# Patient Record
Sex: Female | Born: 1970
Health system: Southern US, Community
[De-identification: ages and names within clinical notes are randomized; demographics above are authoritative.]

## PROBLEM LIST (undated history)

## (undated) DIAGNOSIS — F329 Major depressive disorder, single episode, unspecified: Secondary | ICD-10-CM

## (undated) DIAGNOSIS — F32A Depression, unspecified: Secondary | ICD-10-CM

## (undated) HISTORY — PX: NECK SURGERY: SHX720

## (undated) HISTORY — PX: OTHER SURGICAL HISTORY: SHX169

## (undated) HISTORY — PX: WISDOM TOOTH EXTRACTION: SHX21

---

## 1999-04-19 ENCOUNTER — Other Ambulatory Visit: Admission: RE | Admit: 1999-04-19 | Discharge: 1999-04-19 | Payer: Self-pay | Admitting: Family Medicine

## 2000-09-30 ENCOUNTER — Other Ambulatory Visit: Admission: RE | Admit: 2000-09-30 | Discharge: 2000-09-30 | Payer: Self-pay | Admitting: Family Medicine

## 2001-07-02 ENCOUNTER — Ambulatory Visit (HOSPITAL_BASED_OUTPATIENT_CLINIC_OR_DEPARTMENT_OTHER): Admission: RE | Admit: 2001-07-02 | Discharge: 2001-07-02 | Payer: Self-pay | Admitting: Orthopedic Surgery

## 2001-10-08 ENCOUNTER — Other Ambulatory Visit: Admission: RE | Admit: 2001-10-08 | Discharge: 2001-10-08 | Payer: Self-pay | Admitting: Family Medicine

## 2001-11-06 ENCOUNTER — Other Ambulatory Visit: Admission: RE | Admit: 2001-11-06 | Discharge: 2001-11-06 | Payer: Self-pay | Admitting: Family Medicine

## 2002-11-08 ENCOUNTER — Other Ambulatory Visit: Admission: RE | Admit: 2002-11-08 | Discharge: 2002-11-08 | Payer: Self-pay | Admitting: Family Medicine

## 2003-11-18 ENCOUNTER — Other Ambulatory Visit: Admission: RE | Admit: 2003-11-18 | Discharge: 2003-11-18 | Payer: Self-pay | Admitting: Family Medicine

## 2004-08-27 ENCOUNTER — Ambulatory Visit: Payer: Self-pay

## 2004-09-25 ENCOUNTER — Other Ambulatory Visit: Admission: RE | Admit: 2004-09-25 | Discharge: 2004-09-25 | Payer: Self-pay | Admitting: Family Medicine

## 2006-07-21 ENCOUNTER — Other Ambulatory Visit: Admission: RE | Admit: 2006-07-21 | Discharge: 2006-07-21 | Payer: Self-pay | Admitting: Family Medicine

## 2008-06-21 ENCOUNTER — Other Ambulatory Visit: Admission: RE | Admit: 2008-06-21 | Discharge: 2008-06-21 | Payer: Self-pay | Admitting: Family Medicine

## 2010-08-03 NOTE — Op Note (Signed)
[facility]  Patient:    Diana Rangel, Diana Rangel Visit Number: [id number] MRN: [id number]          Service Type: DSU Location: [facility] Attending Physician:  Nadara Mustard Dictated by:   Nadara Mustard, M.D. Proc. Date: 07/02/01 Admit Date:  07/02/2001                             Operative Report  PREOPERATIVE DIAGNOSIS:  Medial meniscal tear left knee.  POSTOPERATIVE DIAGNOSES: 1. Medial meniscal tear left knee with loose meniscal fragment. 2. 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 3. Partial acromioclavicular tear.  OPERATION/PROCEDURE: 1. Left knee arthroscopy with debridement and partial medial meniscectomy. 2. Abrasion chondroplasty of patella and trochlea. 3. Exam under anesthesia with a negative pivot and shift and negative    anterior drawer.  SURGEON:  Nadara Mustard, M.D.  ANESTHESIA:  General.  ESTIMATED BLOOD LOSS:  Minimal.  ANTIBIOTICS:  None.  BLOOD REPLACEMENT:  None.  DRAINS:  None.  COMPLICATIONS:  None.  DISPOSITION:  To PACU in stable condition.  INDICATIONS:  The patient is a 40 year old woman, active soccer athlete, who has had mechanical symptoms in her left knee. She has failed conservative care and MRI scan confirmed medial meniscal tear and she presents at this time for arthroscopic intervention. The risks and benefits were discussed including infection, neurovascular injury, persistent pain, and/or need for additional surgery. The patient states that she understands and wishes to proceed at this time.  DESCRIPTION OF PROCEDURE:  The patient was brought to OR Room #1 and underwent a general anesthetic. After an adequate level of anesthesia was obtained, the patients left lower extremity was prepped using DuraPrep and draped into a sterile field. The patient did have poison ivy approximately two weeks ago, but all the lesions had completely dried up and there was no open lesions. All areas  had epithelialized and there was none in the area of the direct arthroscopic portals. The arthroscope was inserted through the inferolateral portal and the inferomedial portal was used for a working portal. Visualization of the medial joint line shows her to have a large loose fragment medial meniscal tear. This was debrided. The edge of the meniscus was probed and was stable. She had no cartilage defects of the medial femoral condyle or medial tibial plateau. Paper Mitek was used for hemostasis; this was not used on the cartilage at all. Examination of the notch did show that approximately a half of the ACL had been previously torn remotely. She did have a good pivot shift with no instability and had a stable anterior drawer under anesthesia, but was missing approximately half of her ACL attachment at the femoral notch. Examination in the figure four position showed her to have intact lateral meniscus and there was no cartilage defects of the lateral tibial plateau or lateral femoral condyle. Examination in the patellofemoral joint showed her to have a large grade IV osteochondral defect of the patella and trochlea and this was debrided back to stable cartilage. Visualization of both medial and lateral gutters shows there to be no loose bodies and a survey was then again performed; there was no loose bodies. Hemostasis was obtained with a Paper Mitek. The instruments were removed. The portals were closed using 4-0 nylon. The joint was infused with 20 cc of 0.50% Marcaine plain and 4 mg of morphine. The patient was dressed with Adaptic orthopedic sponges,  sterile web roll and Ace wrap from toes to thighs. She was extubated and taken to the PACU in stable condition. Plan to follow up in the office in two weeks. Dictated by:   Nadara Mustard, M.D. Attending Physician:  Nadara Mustard DD:  07/02/01 TD:  07/03/01 Job: [id number]

## 2012-04-27 ENCOUNTER — Other Ambulatory Visit: Payer: Self-pay | Admitting: Gynecology

## 2012-04-27 DIAGNOSIS — R928 Other abnormal and inconclusive findings on diagnostic imaging of breast: Secondary | ICD-10-CM

## 2012-05-19 ENCOUNTER — Other Ambulatory Visit: Payer: Self-pay

## 2012-05-21 ENCOUNTER — Ambulatory Visit
Admission: RE | Admit: 2012-05-21 | Discharge: 2012-05-21 | Disposition: A | Discharge status: Home / Self Care | Payer: 59 | Source: Ambulatory Visit | Attending: Gynecology | Admitting: Gynecology

## 2012-05-21 DIAGNOSIS — R928 Other abnormal and inconclusive findings on diagnostic imaging of breast: Secondary | ICD-10-CM

## 2012-07-06 ENCOUNTER — Other Ambulatory Visit (HOSPITAL_COMMUNITY): Payer: Self-pay | Admitting: Gynecology

## 2012-07-06 DIAGNOSIS — Z3141 Encounter for fertility testing: Secondary | ICD-10-CM

## 2012-07-14 ENCOUNTER — Ambulatory Visit (HOSPITAL_COMMUNITY)
Admission: RE | Admit: 2012-07-14 | Discharge: 2012-07-14 | Disposition: A | Discharge status: Home / Self Care | Payer: 59 | Source: Ambulatory Visit | Attending: Gynecology | Admitting: Gynecology

## 2012-07-14 DIAGNOSIS — Z3141 Encounter for fertility testing: Secondary | ICD-10-CM

## 2012-07-14 DIAGNOSIS — N979 Female infertility, unspecified: Secondary | ICD-10-CM | POA: Insufficient documentation

## 2012-07-14 MED ORDER — IOHEXOL 300 MG/ML  SOLN
10.0000 mL | Freq: Once | INTRAMUSCULAR | Status: AC | PRN
  Administered 2012-07-14: 20 mL

## 2012-08-13 ENCOUNTER — Encounter (HOSPITAL_COMMUNITY): Payer: Self-pay | Admitting: *Deleted

## 2012-08-13 ENCOUNTER — Encounter (HOSPITAL_COMMUNITY): Payer: Self-pay | Admitting: Pharmacy Technician

## 2012-08-26 ENCOUNTER — Ambulatory Visit (HOSPITAL_COMMUNITY)
Admission: RE | Admit: 2012-08-26 | Discharge: 2012-08-26 | Disposition: A | Discharge status: Home / Self Care | Payer: 59 | Source: Ambulatory Visit | Attending: Obstetrics and Gynecology | Admitting: Obstetrics and Gynecology

## 2012-08-26 ENCOUNTER — Encounter (HOSPITAL_COMMUNITY): Payer: Self-pay | Admitting: Anesthesiology

## 2012-08-26 ENCOUNTER — Encounter (HOSPITAL_COMMUNITY)
Admission: RE | Disposition: A | Discharge status: Home / Self Care | Payer: Self-pay | Source: Ambulatory Visit | Attending: Obstetrics and Gynecology

## 2012-08-26 ENCOUNTER — Ambulatory Visit (HOSPITAL_COMMUNITY): Payer: 59 | Admitting: Anesthesiology

## 2012-08-26 DIAGNOSIS — N979 Female infertility, unspecified: Secondary | ICD-10-CM | POA: Insufficient documentation

## 2012-08-26 HISTORY — DX: Depression, unspecified: F32.A

## 2012-08-26 HISTORY — PX: HYSTEROSCOPY WITH D & C: SHX1775

## 2012-08-26 HISTORY — DX: Major depressive disorder, single episode, unspecified: F32.9

## 2012-08-26 LAB — CBC
Hemoglobin: 13.2 g/dL (ref 12.0–15.0)
MCH: 29.4 pg (ref 26.0–34.0)
MCHC: 33.9 g/dL (ref 30.0–36.0)
Platelets: 186 10*3/uL (ref 150–400)

## 2012-08-26 SURGERY — DILATATION AND CURETTAGE /HYSTEROSCOPY
Anesthesia: General | Site: Vagina | Wound class: Clean Contaminated

## 2012-08-26 MED ORDER — LIDOCAINE HCL (CARDIAC) 20 MG/ML IV SOLN
INTRAVENOUS | Status: AC
  Filled 2012-08-26: qty 5

## 2012-08-26 MED ORDER — PROPOFOL 10 MG/ML IV BOLUS
INTRAVENOUS | Status: DC | PRN
  Administered 2012-08-26: 170 mg via INTRAVENOUS

## 2012-08-26 MED ORDER — IBUPROFEN 200 MG PO TABS: 600.0000 mg | ORAL_TABLET | Freq: Four times a day (QID) | ORAL | Status: DC | PRN

## 2012-08-26 MED ORDER — ONDANSETRON HCL 4 MG/2ML IJ SOLN
INTRAMUSCULAR | Status: AC
  Filled 2012-08-26: qty 2

## 2012-08-26 MED ORDER — LACTATED RINGERS IV SOLN
INTRAVENOUS | Status: DC
  Administered 2012-08-26 (×2): via INTRAVENOUS

## 2012-08-26 MED ORDER — FENTANYL CITRATE 0.05 MG/ML IJ SOLN
INTRAMUSCULAR | Status: AC
  Filled 2012-08-26: qty 2

## 2012-08-26 MED ORDER — GLYCOPYRROLATE 0.2 MG/ML IJ SOLN
INTRAMUSCULAR | Status: AC
  Filled 2012-08-26: qty 1

## 2012-08-26 MED ORDER — LIDOCAINE HCL (CARDIAC) 20 MG/ML IV SOLN
INTRAVENOUS | Status: DC | PRN
  Administered 2012-08-26: 30 mg via INTRAVENOUS
  Administered 2012-08-26: 70 mg via INTRAVENOUS

## 2012-08-26 MED ORDER — MIDAZOLAM HCL 2 MG/2ML IJ SOLN
INTRAMUSCULAR | Status: AC
  Filled 2012-08-26: qty 2

## 2012-08-26 MED ORDER — ONDANSETRON HCL 4 MG/2ML IJ SOLN: 4.0000 mg | Freq: Once | INTRAMUSCULAR | Status: DC | PRN

## 2012-08-26 MED ORDER — KETOROLAC TROMETHAMINE 30 MG/ML IJ SOLN
INTRAMUSCULAR | Status: AC
  Filled 2012-08-26: qty 1

## 2012-08-26 MED ORDER — FENTANYL CITRATE 0.05 MG/ML IJ SOLN
INTRAMUSCULAR | Status: DC | PRN
  Administered 2012-08-26 (×2): 50 ug via INTRAVENOUS

## 2012-08-26 MED ORDER — OXYCODONE-ACETAMINOPHEN 5-325 MG PO TABS: 1.0000 | ORAL_TABLET | ORAL | Status: DC | PRN

## 2012-08-26 MED ORDER — ONDANSETRON HCL 4 MG/2ML IJ SOLN
INTRAMUSCULAR | Status: DC | PRN
  Administered 2012-08-26: 4 mg via INTRAVENOUS

## 2012-08-26 MED ORDER — FENTANYL CITRATE 0.05 MG/ML IJ SOLN: 25.0000 ug | INTRAMUSCULAR | Status: DC | PRN

## 2012-08-26 MED ORDER — LIDOCAINE HCL 1 % IJ SOLN
INTRAMUSCULAR | Status: DC | PRN
  Administered 2012-08-26: 10 mL

## 2012-08-26 MED ORDER — PROPOFOL 10 MG/ML IV EMUL
INTRAVENOUS | Status: AC
  Filled 2012-08-26: qty 20

## 2012-08-26 MED ORDER — DEXTROSE 5 % IV SOLN
2.0000 g | INTRAVENOUS | Status: AC
  Administered 2012-08-26: 2 g via INTRAVENOUS
  Filled 2012-08-26: qty 2

## 2012-08-26 MED ORDER — GLYCOPYRROLATE 0.2 MG/ML IJ SOLN
INTRAMUSCULAR | Status: DC | PRN
  Administered 2012-08-26: 0.2 mg via INTRAVENOUS

## 2012-08-26 MED ORDER — KETOROLAC TROMETHAMINE 30 MG/ML IJ SOLN
INTRAMUSCULAR | Status: DC | PRN
  Administered 2012-08-26: 30 mg via INTRAVENOUS

## 2012-08-26 MED ORDER — KETOROLAC TROMETHAMINE 30 MG/ML IJ SOLN: 15.0000 mg | Freq: Once | INTRAMUSCULAR | Status: DC | PRN

## 2012-08-26 MED ORDER — GLYCINE 1.5 % IR SOLN
Status: DC | PRN
  Administered 2012-08-26: 3000 mL

## 2012-08-26 MED ORDER — MIDAZOLAM HCL 5 MG/5ML IJ SOLN
INTRAMUSCULAR | Status: DC | PRN
  Administered 2012-08-26: 2 mg via INTRAVENOUS

## 2012-08-26 MED ORDER — MEPERIDINE HCL 25 MG/ML IJ SOLN: 6.2500 mg | INTRAMUSCULAR | Status: DC | PRN

## 2012-08-26 SURGICAL SUPPLY — 18 items
ABLATOR ENDOMETRIAL BIPOLAR (ABLATOR) IMPLANT
CANISTER SUCTION 2500CC (MISCELLANEOUS) ×2 IMPLANT
CATH ROBINSON RED A/P 16FR (CATHETERS) ×2 IMPLANT
CATH THERMACHOICE III (CATHETERS) IMPLANT
CLOTH BEACON ORANGE TIMEOUT ST (SAFETY) ×2 IMPLANT
CONTAINER PREFILL 10% NBF 60ML (FORM) ×4 IMPLANT
DRESSING TELFA 8X3 (GAUZE/BANDAGES/DRESSINGS) ×2 IMPLANT
ELECT REM PT RETURN 9FT ADLT (ELECTROSURGICAL) ×2
ELECTRODE REM PT RTRN 9FT ADLT (ELECTROSURGICAL) ×1 IMPLANT
GLOVE BIO SURGEON STRL SZ 6.5 (GLOVE) ×2 IMPLANT
GLOVE BIOGEL PI IND STRL 7.0 (GLOVE) ×1 IMPLANT
GLOVE BIOGEL PI INDICATOR 7.0 (GLOVE) ×1
GOWN STRL REIN XL XLG (GOWN DISPOSABLE) ×4 IMPLANT
LOOP ANGLED CUTTING 22FR (CUTTING LOOP) IMPLANT
PACK HYSTEROSCOPY LF (CUSTOM PROCEDURE TRAY) ×2 IMPLANT
PAD OB MATERNITY 4.3X12.25 (PERSONAL CARE ITEMS) ×2 IMPLANT
TOWEL OR 17X24 6PK STRL BLUE (TOWEL DISPOSABLE) ×4 IMPLANT
WATER STERILE IRR 1000ML POUR (IV SOLUTION) ×2 IMPLANT

## 2012-08-26 NOTE — Transfer of Care (Signed)
Immediate Anesthesia Transfer of Care Note  Patient: Diana Rangel  Procedure(s) Performed: Procedure(s): DILATATION AND CURETTAGE /HYSTEROSCOPY (N/A)  Patient Location: PACU  Anesthesia Type:General  Level of Consciousness: awake, oriented and patient cooperative  Airway & Oxygen Therapy: Patient Spontanous Breathing and Patient connected to face mask oxygen  Post-op Assessment: Report given to PACU RN and Post -op Vital signs reviewed and stable  Post vital signs: Reviewed and stable  Complications: No apparent anesthesia complications

## 2012-08-26 NOTE — H&P (Signed)
42 yo G0 following with dr Chevis Pretty for infertility presents for surgical mngt for possible endometrial polyp.  PMHx:  Depression PSHx:  Knee surgery Shx:  No tobacco, ivdu, or etoh All:  None Meds:  Prozac, vitamins, fish oil, glucosamine FHx:  N/c  Af, vss Gen - NAD CV - RRR Lungs - clear bilaterally Abd - soft, NT Ext - NT  A/P:  Infertility and endometrial polyp  Hysteroscopy, dilation and curettage, removal of endometrial mass

## 2012-08-26 NOTE — Anesthesia Postprocedure Evaluation (Signed)
  Anesthesia Post Note  Patient: Diana Rangel  Procedure(s) Performed: Procedure(s) (LRB): DILATATION AND CURETTAGE /HYSTEROSCOPY (N/A)  Anesthesia type: GA  Patient location: PACU  Post pain: Pain level controlled  Post assessment: Post-op Vital signs reviewed  Last Vitals:  Filed Vitals:   08/26/12 1400  BP: 103/71  Pulse: 44  Temp:   Resp: 16    Post vital signs: Reviewed  Level of consciousness: sedated  Complications: No apparent anesthesia complications

## 2012-08-26 NOTE — Anesthesia Preprocedure Evaluation (Signed)
Anesthesia Evaluation  Patient identified by MRN, date of birth, ID band  Reviewed: Allergy & Precautions, H&P , NPO status , Patient's Chart, lab work & pertinent test results  Airway Mallampati: I TM Distance: >3 FB Neck ROM: full    Dental no notable dental hx. (+) Teeth Intact   Pulmonary neg pulmonary ROS,    Pulmonary exam normal       Cardiovascular negative cardio ROS      Neuro/Psych negative neurological ROS  negative psych ROS   GI/Hepatic negative GI ROS, Neg liver ROS,   Endo/Other  negative endocrine ROS  Renal/GU negative Renal ROS  negative genitourinary   Musculoskeletal negative musculoskeletal ROS (+)   Abdominal Normal abdominal exam  (+)   Peds negative pediatric ROS (+)  Hematology negative hematology ROS (+)   Anesthesia Other Findings   Reproductive/Obstetrics negative OB ROS                           Anesthesia Physical Anesthesia Plan  ASA: I  Anesthesia Plan: General   Post-op Pain Management:    Induction: Intravenous  Airway Management Planned: LMA  Additional Equipment:   Intra-op Plan:   Post-operative Plan:   Informed Consent: I have reviewed the patients History and Physical, chart, labs and discussed the procedure including the risks, benefits and alternatives for the proposed anesthesia with the patient or authorized representative who has indicated his/her understanding and acceptance.     Plan Discussed with: CRNA and Surgeon  Anesthesia Plan Comments:         Anesthesia Quick Evaluation

## 2012-08-27 ENCOUNTER — Encounter (HOSPITAL_COMMUNITY): Payer: Self-pay | Admitting: Obstetrics and Gynecology

## 2012-08-27 NOTE — Op Note (Signed)
NAME:  Diana Rangel, Diana Rangel NO.:  [id number]  MEDICAL RECORD NO.:  [id number]  LOCATION:  [facility]                          FACILITY:  [facility]  PHYSICIAN:  Zelphia Cairo, MD    DATE OF BIRTH:  [birth year]  DATE OF PROCEDURE:  08/26/2012 DATE OF DISCHARGE:  08/26/2012                              OPERATIVE REPORT   PREOPERATIVE DIAGNOSES: 1. Infertility. 2. Possible endometrial mass.  POSTOPERATIVE DIAGNOSES: 1. Infertility. 2. Possible endometrial mass. 3. Irregular endometrial lining.  PROCEDURE: 1. Cervical block. 2. Hysteroscopy. 3. Dilation and curettage.  SURGEON:  Zelphia Cairo, MD  ANESTHESIA:  General.  COMPLICATIONS:  None.  CONDITION:  Stable to recovery room.  SPECIMENS:  Endometrial curettings.  DESCRIPTION OF PROCEDURE:  The patient was taken to the operating room, where general anesthesia was obtained.  She was placed in the dorsal lithotomy position.  An in and out catheter was used to drain her bladder for an unmeasured amount of urine.  Bivalve speculum was placed in the vagina and 1 mL of 1% Xylocaine was placed and anterior lip of the cervix.  Single-tooth tenaculum was attached to the anterior lip of the cervix and the remaining 9 mL were used to perform a cervical block. The cervix was then serially dilated using Pratt dilators and the diagnostic hysteroscope was inserted.  The survey was performed. Bilateral ostia were visualized and appeared normal.  She had some thickened secretory irregular endometrium on the anterior and posterior uterine wall.  The fundus and sidewalls appeared more regular, but had no evidence of polyp or mass were identified.  Because of this, a gentle curetting was performed anteriorly and posteriorly.  The specimen was placed on Telfa and passed off to be sent to pathology.  Tenaculum was then removed from the cervix.  The cervix was hemostatic.  Speculum was removed.  The patient was taken to the recovery  room in stable condition.  Sponge, lap, instrument, and needle count were correct x2.     Zelphia Cairo, MD     GA/MEDQ  D:  08/26/2012  T:  08/27/2012  Job:  [id number]

## 2012-10-05 ENCOUNTER — Other Ambulatory Visit (HOSPITAL_COMMUNITY): Payer: Self-pay | Admitting: Gynecology

## 2012-10-05 DIAGNOSIS — Z3141 Encounter for fertility testing: Secondary | ICD-10-CM

## 2012-10-13 ENCOUNTER — Ambulatory Visit (HOSPITAL_COMMUNITY): Payer: 59

## 2012-10-15 ENCOUNTER — Ambulatory Visit (HOSPITAL_COMMUNITY)
Admission: RE | Admit: 2012-10-15 | Discharge: 2012-10-15 | Disposition: A | Discharge status: Home / Self Care | Payer: 59 | Source: Ambulatory Visit | Attending: Gynecology | Admitting: Gynecology

## 2012-10-15 DIAGNOSIS — Z3141 Encounter for fertility testing: Secondary | ICD-10-CM

## 2012-10-15 DIAGNOSIS — N979 Female infertility, unspecified: Secondary | ICD-10-CM | POA: Insufficient documentation

## 2012-10-15 MED ORDER — IOHEXOL 300 MG/ML  SOLN
20.0000 mL | Freq: Once | INTRAMUSCULAR | Status: AC | PRN
  Administered 2012-10-15: 10 mL

## 2013-05-13 ENCOUNTER — Emergency Department (HOSPITAL_COMMUNITY)
Admission: EM | Admit: 2013-05-13 | Discharge: 2013-05-13 | Disposition: A | Discharge status: Home / Self Care | Payer: 59 | Source: Home / Self Care | Attending: Emergency Medicine | Admitting: Emergency Medicine

## 2013-05-13 ENCOUNTER — Encounter (HOSPITAL_COMMUNITY): Payer: Self-pay | Admitting: Emergency Medicine

## 2013-05-13 DIAGNOSIS — A088 Other specified intestinal infections: Secondary | ICD-10-CM

## 2013-05-13 DIAGNOSIS — A084 Viral intestinal infection, unspecified: Secondary | ICD-10-CM

## 2013-05-13 LAB — POCT I-STAT, CHEM 8
BUN: 17 mg/dL (ref 6–23)
CALCIUM ION: 1.05 mmol/L — AB (ref 1.12–1.23)
Chloride: 105 mEq/L (ref 96–112)
Creatinine, Ser: 0.8 mg/dL (ref 0.50–1.10)
GLUCOSE: 124 mg/dL — AB (ref 70–99)
HEMATOCRIT: 44 % (ref 36.0–46.0)
HEMOGLOBIN: 15 g/dL (ref 12.0–15.0)
Potassium: 4.1 mEq/L (ref 3.7–5.3)
Sodium: 141 mEq/L (ref 137–147)
TCO2: 24 mmol/L (ref 0–100)

## 2013-05-13 LAB — POCT URINALYSIS DIP (DEVICE)
Bilirubin Urine: NEGATIVE
Glucose, UA: NEGATIVE mg/dL
Hgb urine dipstick: NEGATIVE
Ketones, ur: NEGATIVE mg/dL
LEUKOCYTES UA: NEGATIVE
NITRITE: NEGATIVE
PH: 7 (ref 5.0–8.0)
PROTEIN: NEGATIVE mg/dL
Specific Gravity, Urine: 1.025 (ref 1.005–1.030)
Urobilinogen, UA: 0.2 mg/dL (ref 0.0–1.0)

## 2013-05-13 LAB — POCT PREGNANCY, URINE: Preg Test, Ur: NEGATIVE

## 2013-05-13 MED ORDER — ONDANSETRON 8 MG PO TBDP: 8.0000 mg | ORAL_TABLET | Freq: Three times a day (TID) | ORAL | Status: DC | PRN

## 2013-05-13 MED ORDER — ONDANSETRON HCL 4 MG/2ML IJ SOLN: 4.0000 mg | Freq: Once | INTRAMUSCULAR | Status: DC

## 2013-05-13 MED ORDER — ONDANSETRON HCL 4 MG/2ML IJ SOLN
INTRAMUSCULAR | Status: AC
  Filled 2013-05-13: qty 2

## 2013-05-13 MED ORDER — SODIUM CHLORIDE 0.9 % IV SOLN
INTRAVENOUS | Status: DC
  Administered 2013-05-13 (×3): via INTRAVENOUS

## 2013-05-13 MED ORDER — DIPHENOXYLATE-ATROPINE 2.5-0.025 MG PO TABS: 1.0000 | ORAL_TABLET | Freq: Four times a day (QID) | ORAL | Status: DC | PRN

## 2013-05-13 MED ORDER — ONDANSETRON HCL 4 MG/2ML IJ SOLN
4.0000 mg | Freq: Once | INTRAMUSCULAR | Status: AC
  Administered 2013-05-13: 4 mg via INTRAVENOUS

## 2013-05-13 NOTE — ED Notes (Signed)
Iv  Ns  1    Liter  Bolus

## 2013-05-13 NOTE — ED Provider Notes (Signed)
Chief Complaint   Chief Complaint  Patient presents with  . Nausea     History of Present Illness   Diana Rangel is a 43 year old female who has had a history since 5 AM today of nausea, vomiting, and diarrhea. She had 3 episodes of emesis and 4 episodes of diarrhea. The emesis was nonbloody, and no coffee-ground emesis or bilious emesis, the diarrhea was nonbloody and there was no melena. The patient denies any done pain or cramping. She did have one episode of syncope. She was on the commode, broke out in a sweat, and passed out for a few seconds. After that she felt weak and tired and rundown. She's had some headache and felt somewhat hot. She's had no specific sick exposures. No suspicious ingestions. No foreign travel.  Review of Systems   Other than as noted above, the patient denies any of the following symptoms: Systemic:  No fevers, chills, sweats, weight loss or gain, fatigue, or tiredness. ENT:  No nasal congestion, rhinorrhea, or sore throat. Lungs:  No cough, wheezing, or shortness of breath. Cardiac:  No chest pain, syncope, or presyncope. GI:  No abdominal pain, nausea, vomiting, anorexia, diarrhea, constipation, blood in stool or vomitus. GU:  No dysuria, frequency, or urgency.  [place]   Past medical history, family history, social history, meds, and allergies were reviewed.  Her only medication is Prozac.  Physical Exam     Vital signs:  BP 95/55  Pulse 92  Temp(Src) 98.1 F (36.7 C) (Oral)  Resp 16  SpO2 100%  LMP 05/13/2013 General:  Alert and oriented.  In no distress.  Skin warm and dry.  Good skin turgor, brisk capillary refill. ENT:  No scleral icterus, moist mucous membranes, no oral lesions, pharynx clear. Lungs:  Breath sounds clear and equal bilaterally.  No wheezes, rales, or rhonchi. Heart:  Rhythm regular, without extrasystoles.  No gallops or murmers. Abdomen:  Abdomen is soft and flat and nontender. There is no organomegaly or mass. Bowel  sounds are normally active. Skin: Clear, warm, and dry.  Good turgor.  Brisk capillary refill.  Labs   Results for orders placed during the hospital encounter of 05/13/13  POCT URINALYSIS DIP (DEVICE)      Result Value Ref Range   Glucose, UA NEGATIVE  NEGATIVE mg/dL   Bilirubin Urine NEGATIVE  NEGATIVE   Ketones, ur NEGATIVE  NEGATIVE mg/dL   Specific Gravity, Urine 1.025  1.005 - 1.030   Hgb urine dipstick NEGATIVE  NEGATIVE   pH 7.0  5.0 - 8.0   Protein, ur NEGATIVE  NEGATIVE mg/dL   Urobilinogen, UA 0.2  0.0 - 1.0 mg/dL   Nitrite NEGATIVE  NEGATIVE   Leukocytes, UA NEGATIVE  NEGATIVE  POCT PREGNANCY, URINE      Result Value Ref Range   Preg Test, Ur NEGATIVE  NEGATIVE  POCT I-STAT, CHEM 8      Result Value Ref Range   Sodium 141  137 - 147 mEq/L   Potassium 4.1  3.7 - 5.3 mEq/L   Chloride 105  96 - 112 mEq/L   BUN 17  6 - 23 mg/dL   Creatinine, Ser 0.80  0.50 - 1.10 mg/dL   Glucose, Bld 124 (*) 70 - 99 mg/dL   Calcium, Ion 1.05 (*) 1.12 - 1.23 mmol/L   TCO2 24  0 - 100 mmol/L   Hemoglobin 15.0  12.0 - 15.0 g/dL   HCT 44.0  36.0 - 46.0 %  Course in Urgent [place]   She was given IV normal saline, all 1000 mL over about one hour. She was also given Zofran 4 mg intravenously. Thereafter she states she felt a lot better.   Assessment   The encounter diagnosis was Viral gastroenteritis.  With mild dehydration.  Plan   1.  Meds:  The following meds were prescribed:   Discharge Medication List as of 05/13/2013 12:49 PM    START taking these medications   Details  diphenoxylate-atropine (LOMOTIL) 2.5-0.025 MG per tablet Take 1 tablet by mouth 4 (four) times daily as needed for diarrhea or loose stools., Starting 05/13/2013, Until Discontinued, Print    ondansetron (ZOFRAN ODT) 8 MG disintegrating tablet Take 1 tablet (8 mg total) by mouth every 8 (eight) hours as needed for nausea., Starting 05/13/2013, Until Discontinued, Normal        2.  Patient  Education/Counseling:  The patient was given appropriate handouts, self care instructions, and instructed in symptomatic relief. The patient was told to stay on clear liquids for the remainder of the day, then advance to a B.R.A.T. diet starting tomorrow.   3.  Follow up:  The patient was told to follow up here if no better in 2 to 3 days, or sooner if becoming worse in any way, and given some red flag symptoms such as persistent vomitng, high fever, severe abdominal pain, or any GI bleeding which would prompt immediate return.         Harden Mo, MD 05/13/13 [phone]

## 2013-05-13 NOTE — ED Notes (Signed)
Pt    Reports    Symptoms  Of  Nausea           And     Vomiting      And           Had  A   Syncopal  Spell          Today       At       Home

## 2013-05-13 NOTE — Discharge Instructions (Signed)

## 2013-07-05 ENCOUNTER — Other Ambulatory Visit: Payer: Self-pay | Admitting: Gynecology

## 2013-07-05 DIAGNOSIS — R928 Other abnormal and inconclusive findings on diagnostic imaging of breast: Secondary | ICD-10-CM

## 2013-07-16 ENCOUNTER — Ambulatory Visit
Admission: RE | Admit: 2013-07-16 | Discharge: 2013-07-16 | Disposition: A | Discharge status: Home / Self Care | Payer: 59 | Source: Ambulatory Visit | Attending: Gynecology | Admitting: Gynecology

## 2013-07-16 DIAGNOSIS — R928 Other abnormal and inconclusive findings on diagnostic imaging of breast: Secondary | ICD-10-CM

## 2014-08-13 ENCOUNTER — Encounter (HOSPITAL_COMMUNITY): Payer: Self-pay | Admitting: Emergency Medicine

## 2014-08-13 ENCOUNTER — Emergency Department (HOSPITAL_COMMUNITY)
Admission: EM | Admit: 2014-08-13 | Discharge: 2014-08-13 | Disposition: A | Discharge status: Home / Self Care | Payer: 59 | Source: Home / Self Care | Attending: Family Medicine | Admitting: Family Medicine

## 2014-08-13 DIAGNOSIS — S61259A Open bite of unspecified finger without damage to nail, initial encounter: Secondary | ICD-10-CM | POA: Diagnosis not present

## 2014-08-13 DIAGNOSIS — W540XXA Bitten by dog, initial encounter: Principal | ICD-10-CM

## 2014-08-13 MED ORDER — AMOXICILLIN-POT CLAVULANATE 875-125 MG PO TABS
1.0000 | ORAL_TABLET | Freq: Two times a day (BID) | ORAL | 0 refills | Status: DC
  Filled 2021-12-19: qty 14, 7d supply, fill #0

## 2014-08-13 NOTE — Discharge Instructions (Signed)
Warm soak twice a day, take all of medicine as prescribed, return or see orthopedist if further problems

## 2014-08-13 NOTE — ED Notes (Signed)
Pt states that she accidentally got bite by her dog on her right hand

## 2014-08-13 NOTE — ED Provider Notes (Signed)
CSN: [id number]     Arrival date & time 08/13/14  0915 History   First MD Initiated Contact with Patient 08/13/14 1007     Chief Complaint  Patient presents with  . Animal Bite   (Consider location/radiation/quality/duration/timing/severity/associated sxs/prior Treatment) Patient is a 44 y.o. female presenting with animal bite. The history is provided by the patient.  Animal Bite Contact animal:  Dog Location:  Hand Hand injury location:  R fingers Time since incident:  1 day Pain details:    Quality:  Sore   Severity:  Mild Incident location:  Home Provoked: unprovoked   Notifications:  None Animal's rabies vaccination status:  Up to date Animal in possession: yes   Tetanus status:  Up to date Associated symptoms: swelling   Associated symptoms: no fever and no numbness     Past Medical History  Diagnosis Date  . Depression    Past Surgical History  Procedure Laterality Date  . Neck surgery      cyst surgery  . Left knee surgery      arthroscopic  . Wisdom tooth extraction    . Hysteroscopy w/d&c N/A 08/26/2012    Procedure: DILATATION AND CURETTAGE /HYSTEROSCOPY;  Surgeon: Marylynn Pearson, MD;  Location: [facility];  Service: Gynecology;  Laterality: N/A;   History reviewed. No pertinent family history. History  Substance Use Topics  . Smoking status: Never Smoker   . Smokeless tobacco: Never Used  . Alcohol Use: Yes     Comment: socially   OB History    No data available     Review of Systems  Constitutional: Negative.  Negative for fever.  Musculoskeletal: Positive for joint swelling.  Skin: Positive for wound.  Neurological: Negative for numbness.    Allergies  Review of patient's allergies indicates no known allergies.  Home Medications   Prior to Admission medications   Medication Sig Start Date End Date Taking? Authorizing Provider  amoxicillin-clavulanate (AUGMENTIN) 875-125 MG per tablet Take 1 tablet by mouth 2 (two) times daily. 08/13/14    Billy Fischer, MD  B Complex Vitamins (B COMPLEX PO) Take 1 tablet by mouth daily.    Historical Provider, MD  CALCIUM PO Take 1 tablet by mouth 2 (two) times daily.    Historical Provider, MD  diphenoxylate-atropine (LOMOTIL) 2.5-0.025 MG per tablet Take 1 tablet by mouth 4 (four) times daily as needed for diarrhea or loose stools. 05/13/13   Harden Mo, MD  FLUoxetine (PROZAC) 20 MG tablet Take 20 mg by mouth daily.    Historical Provider, MD  Glucos-MSM-C-Mn-Ginger-Willow (GLUCOSAMINE MSM COMPLEX PO) Take 1 tablet by mouth 2 (two) times daily.    Historical Provider, MD  hydroquinone 2 % cream Apply 1 application topically daily.    Historical Provider, MD  ibuprofen (ADVIL,MOTRIN) 200 MG tablet Take 400 mg by mouth every 6 (six) hours as needed for pain.    Historical Provider, MD  ibuprofen (MOTRIN IB) 200 MG tablet Take 3 tablets (600 mg total) by mouth every 6 (six) hours as needed for pain. 08/26/12   Marylynn Pearson, MD  Multiple Vitamin (MULTIVITAMIN WITH MINERALS) TABS Take 1 tablet by mouth daily.    Historical Provider, MD  Omega-3 Fatty Acids (FISH OIL PO) Take 2 capsules by mouth 2 (two) times daily.    Historical Provider, MD  ondansetron (ZOFRAN ODT) 8 MG disintegrating tablet Take 1 tablet (8 mg total) by mouth every 8 (eight) hours as needed for nausea. 05/13/13   Monia Sabal  Jake Michaelis, MD  oxyCODONE-acetaminophen (PERCOCET/ROXICET) 5-325 MG per tablet Take 1-2 tablets by mouth every 4 (four) hours as needed for pain. 08/26/12   Marylynn Pearson, MD  vitamin E 400 UNIT capsule Take 800 Units by mouth daily.    Historical Provider, MD   BP 117/78 mmHg  Pulse 62  Temp(Src) 98 F (36.7 C) (Oral)  Resp 16  SpO2 100%  LMP 08/13/2014 Physical Exam  Constitutional: She is oriented to person, place, and time. She appears well-developed and well-nourished.  Musculoskeletal: She exhibits tenderness.       Hands: Neurological: She is alert and oriented to person, place, and time.  Skin:  Skin is warm and dry.  Nursing note and vitals reviewed.   ED Course  Procedures (including critical care time) Labs Review Labs Reviewed - No data to display  Imaging Review No results found.   MDM   1. Dog bite of finger, initial encounter        Billy Fischer, MD 08/13/14 1032

## 2015-03-23 DIAGNOSIS — M9905 Segmental and somatic dysfunction of pelvic region: Secondary | ICD-10-CM | POA: Diagnosis not present

## 2015-03-23 DIAGNOSIS — M5412 Radiculopathy, cervical region: Secondary | ICD-10-CM | POA: Diagnosis not present

## 2015-03-23 DIAGNOSIS — M9901 Segmental and somatic dysfunction of cervical region: Secondary | ICD-10-CM | POA: Diagnosis not present

## 2015-03-23 DIAGNOSIS — M438X9 Other specified deforming dorsopathies, site unspecified: Secondary | ICD-10-CM | POA: Diagnosis not present

## 2015-04-11 MED FILL — FLUoxetine HCL 20 MG CAPS: 20 | 90 days supply | Qty: 270 | Fill #0

## 2015-04-15 DIAGNOSIS — F411 Generalized anxiety disorder: Secondary | ICD-10-CM | POA: Diagnosis not present

## 2015-04-20 DIAGNOSIS — M9905 Segmental and somatic dysfunction of pelvic region: Secondary | ICD-10-CM | POA: Diagnosis not present

## 2015-04-20 DIAGNOSIS — M5412 Radiculopathy, cervical region: Secondary | ICD-10-CM | POA: Diagnosis not present

## 2015-04-20 DIAGNOSIS — M9901 Segmental and somatic dysfunction of cervical region: Secondary | ICD-10-CM | POA: Diagnosis not present

## 2015-04-20 DIAGNOSIS — M438X9 Other specified deforming dorsopathies, site unspecified: Secondary | ICD-10-CM | POA: Diagnosis not present

## 2015-05-16 ENCOUNTER — Telehealth: Payer: 59 | Admitting: Nurse Practitioner

## 2015-05-16 DIAGNOSIS — R05 Cough: Secondary | ICD-10-CM | POA: Diagnosis not present

## 2015-05-16 DIAGNOSIS — R059 Cough, unspecified: Secondary | ICD-10-CM

## 2015-05-16 MED ORDER — AZITHROMYCIN 250 MG PO TABS: ORAL_TABLET | ORAL | Status: DC

## 2015-05-16 MED ORDER — BENZONATATE 100 MG PO CAPS: 100.0000 mg | ORAL_CAPSULE | Freq: Three times a day (TID) | ORAL | Status: DC | PRN

## 2015-05-16 NOTE — Progress Notes (Signed)

## 2015-05-17 DIAGNOSIS — F4322 Adjustment disorder with anxiety: Secondary | ICD-10-CM | POA: Diagnosis not present

## 2015-05-17 MED FILL — AZITHROMYCIN 250 MG TABLET: 250 | 5 days supply | Qty: 6 | Fill #0

## 2015-05-17 MED FILL — BENZONATATE 100 MG CAPSULE: 100 | 6 days supply | Qty: 20 | Fill #0

## 2015-07-26 MED FILL — FLUoxetine HCL 20 MG CAPS: 20 | 90 days supply | Qty: 270 | Fill #1

## 2015-08-08 DIAGNOSIS — Z01419 Encounter for gynecological examination (general) (routine) without abnormal findings: Secondary | ICD-10-CM | POA: Diagnosis not present

## 2015-08-08 DIAGNOSIS — Z1231 Encounter for screening mammogram for malignant neoplasm of breast: Secondary | ICD-10-CM | POA: Diagnosis not present

## 2015-08-08 DIAGNOSIS — Z6826 Body mass index (BMI) 26.0-26.9, adult: Secondary | ICD-10-CM | POA: Diagnosis not present

## 2015-08-22 DIAGNOSIS — Z3A29 29 weeks gestation of pregnancy: Secondary | ICD-10-CM | POA: Diagnosis not present

## 2015-08-22 DIAGNOSIS — Z1329 Encounter for screening for other suspected endocrine disorder: Secondary | ICD-10-CM | POA: Diagnosis not present

## 2015-08-22 DIAGNOSIS — Z13220 Encounter for screening for lipoid disorders: Secondary | ICD-10-CM | POA: Diagnosis not present

## 2015-08-22 DIAGNOSIS — Z131 Encounter for screening for diabetes mellitus: Secondary | ICD-10-CM | POA: Diagnosis not present

## 2015-08-22 DIAGNOSIS — Z1321 Encounter for screening for nutritional disorder: Secondary | ICD-10-CM | POA: Diagnosis not present

## 2015-12-13 MED FILL — FLUoxetine HCL 20 MG CAPS: 20 | 90 days supply | Qty: 270 | Fill #0

## 2016-02-22 DIAGNOSIS — Z79899 Other long term (current) drug therapy: Secondary | ICD-10-CM | POA: Diagnosis not present

## 2016-02-22 DIAGNOSIS — L700 Acne vulgaris: Secondary | ICD-10-CM | POA: Diagnosis not present

## 2016-03-26 DIAGNOSIS — Z23 Encounter for immunization: Secondary | ICD-10-CM | POA: Diagnosis not present

## 2016-03-26 DIAGNOSIS — L700 Acne vulgaris: Secondary | ICD-10-CM | POA: Diagnosis not present

## 2016-03-26 DIAGNOSIS — Z79899 Other long term (current) drug therapy: Secondary | ICD-10-CM | POA: Diagnosis not present

## 2016-03-27 MED FILL — MYORISAN 40 MG CAPSULE: 40 | 30 days supply | Qty: 30 | Fill #0

## 2016-04-11 MED FILL — FLUoxetine HCL 20 MG CAPS: 20 | 90 days supply | Qty: 270 | Fill #0

## 2016-04-30 DIAGNOSIS — Z79899 Other long term (current) drug therapy: Secondary | ICD-10-CM | POA: Diagnosis not present

## 2016-04-30 DIAGNOSIS — Z23 Encounter for immunization: Secondary | ICD-10-CM | POA: Diagnosis not present

## 2016-04-30 DIAGNOSIS — L700 Acne vulgaris: Secondary | ICD-10-CM | POA: Diagnosis not present

## 2016-05-01 MED FILL — MYORISAN 40 MG CAPSULE: 40 | 30 days supply | Qty: 60 | Fill #0

## 2016-05-30 DIAGNOSIS — Z79899 Other long term (current) drug therapy: Secondary | ICD-10-CM | POA: Diagnosis not present

## 2016-05-30 DIAGNOSIS — L700 Acne vulgaris: Secondary | ICD-10-CM | POA: Diagnosis not present

## 2016-05-31 MED FILL — MYORISAN 40 MG CAPSULE: 40 | 30 days supply | Qty: 60 | Fill #0

## 2016-06-28 DIAGNOSIS — M2242 Chondromalacia patellae, left knee: Secondary | ICD-10-CM | POA: Diagnosis not present

## 2016-06-28 DIAGNOSIS — M2241 Chondromalacia patellae, right knee: Secondary | ICD-10-CM | POA: Diagnosis not present

## 2016-07-02 DIAGNOSIS — Z79899 Other long term (current) drug therapy: Secondary | ICD-10-CM | POA: Diagnosis not present

## 2016-07-02 DIAGNOSIS — L700 Acne vulgaris: Secondary | ICD-10-CM | POA: Diagnosis not present

## 2016-07-02 DIAGNOSIS — L723 Sebaceous cyst: Secondary | ICD-10-CM | POA: Diagnosis not present

## 2016-07-02 DIAGNOSIS — L7211 Pilar cyst: Secondary | ICD-10-CM | POA: Diagnosis not present

## 2016-07-04 MED FILL — MYORISAN 40 MG CAPSULE: 40 | 30 days supply | Qty: 60 | Fill #0

## 2016-07-09 DIAGNOSIS — M2242 Chondromalacia patellae, left knee: Secondary | ICD-10-CM | POA: Diagnosis not present

## 2016-07-09 DIAGNOSIS — M25562 Pain in left knee: Secondary | ICD-10-CM | POA: Diagnosis not present

## 2016-07-09 DIAGNOSIS — M2241 Chondromalacia patellae, right knee: Secondary | ICD-10-CM | POA: Diagnosis not present

## 2016-07-09 DIAGNOSIS — M25561 Pain in right knee: Secondary | ICD-10-CM | POA: Diagnosis not present

## 2016-07-23 MED FILL — FLUoxetine HCL 20 MG CAPS: 20 | 90 days supply | Qty: 270 | Fill #1

## 2016-08-06 DIAGNOSIS — Z79899 Other long term (current) drug therapy: Secondary | ICD-10-CM | POA: Diagnosis not present

## 2016-08-06 DIAGNOSIS — L700 Acne vulgaris: Secondary | ICD-10-CM | POA: Diagnosis not present

## 2016-08-07 MED FILL — MYORISAN 30 MG CAPSULE: 30 | 30 days supply | Qty: 60 | Fill #0

## 2016-09-03 DIAGNOSIS — M2242 Chondromalacia patellae, left knee: Secondary | ICD-10-CM | POA: Diagnosis not present

## 2016-09-03 DIAGNOSIS — M25561 Pain in right knee: Secondary | ICD-10-CM | POA: Diagnosis not present

## 2016-09-03 DIAGNOSIS — M2241 Chondromalacia patellae, right knee: Secondary | ICD-10-CM | POA: Diagnosis not present

## 2016-09-03 DIAGNOSIS — M25562 Pain in left knee: Secondary | ICD-10-CM | POA: Diagnosis not present

## 2016-09-05 DIAGNOSIS — Z1231 Encounter for screening mammogram for malignant neoplasm of breast: Secondary | ICD-10-CM | POA: Diagnosis not present

## 2016-09-05 DIAGNOSIS — Z01419 Encounter for gynecological examination (general) (routine) without abnormal findings: Secondary | ICD-10-CM | POA: Diagnosis not present

## 2016-09-05 DIAGNOSIS — Z6827 Body mass index (BMI) 27.0-27.9, adult: Secondary | ICD-10-CM | POA: Diagnosis not present

## 2016-09-10 DIAGNOSIS — L700 Acne vulgaris: Secondary | ICD-10-CM | POA: Diagnosis not present

## 2016-09-10 DIAGNOSIS — L811 Chloasma: Secondary | ICD-10-CM | POA: Diagnosis not present

## 2016-09-10 DIAGNOSIS — Z79899 Other long term (current) drug therapy: Secondary | ICD-10-CM | POA: Diagnosis not present

## 2016-09-10 MED FILL — MYORISAN 30 MG CAPSULE: 30 | 30 days supply | Qty: 30 | Fill #0

## 2016-09-17 DIAGNOSIS — Z131 Encounter for screening for diabetes mellitus: Secondary | ICD-10-CM | POA: Diagnosis not present

## 2016-09-17 DIAGNOSIS — Z1321 Encounter for screening for nutritional disorder: Secondary | ICD-10-CM | POA: Diagnosis not present

## 2016-09-17 DIAGNOSIS — Z1329 Encounter for screening for other suspected endocrine disorder: Secondary | ICD-10-CM | POA: Diagnosis not present

## 2016-09-17 DIAGNOSIS — Z13220 Encounter for screening for lipoid disorders: Secondary | ICD-10-CM | POA: Diagnosis not present

## 2016-10-02 DIAGNOSIS — M2241 Chondromalacia patellae, right knee: Secondary | ICD-10-CM | POA: Diagnosis not present

## 2016-10-17 DIAGNOSIS — L700 Acne vulgaris: Secondary | ICD-10-CM | POA: Diagnosis not present

## 2016-10-17 DIAGNOSIS — Z79899 Other long term (current) drug therapy: Secondary | ICD-10-CM | POA: Diagnosis not present

## 2016-10-17 DIAGNOSIS — D225 Melanocytic nevi of trunk: Secondary | ICD-10-CM | POA: Diagnosis not present

## 2016-11-14 MED FILL — FLUoxetine HCL 20 MG CAPS: 20 | 90 days supply | Qty: 270 | Fill #2

## 2016-11-25 DIAGNOSIS — D225 Melanocytic nevi of trunk: Secondary | ICD-10-CM | POA: Diagnosis not present

## 2016-11-25 DIAGNOSIS — Z79899 Other long term (current) drug therapy: Secondary | ICD-10-CM | POA: Diagnosis not present

## 2016-11-25 DIAGNOSIS — L700 Acne vulgaris: Secondary | ICD-10-CM | POA: Diagnosis not present

## 2017-01-31 ENCOUNTER — Telehealth: Payer: 59 | Admitting: Family

## 2017-01-31 DIAGNOSIS — B9689 Other specified bacterial agents as the cause of diseases classified elsewhere: Secondary | ICD-10-CM

## 2017-01-31 DIAGNOSIS — J028 Acute pharyngitis due to other specified organisms: Secondary | ICD-10-CM

## 2017-01-31 MED ORDER — AZITHROMYCIN 250 MG PO TABS: ORAL_TABLET | ORAL | 0 refills | Status: DC

## 2017-01-31 MED ORDER — PREDNISONE 5 MG PO TABS: 5.0000 mg | ORAL_TABLET | ORAL | 0 refills | Status: DC

## 2017-01-31 MED ORDER — BENZONATATE 100 MG PO CAPS: 100.0000 mg | ORAL_CAPSULE | Freq: Three times a day (TID) | ORAL | 0 refills | Status: DC | PRN

## 2017-01-31 MED FILL — AZITHROMYCIN 250 MG TAB: 250 | 5 days supply | Qty: 6 | Fill #0

## 2017-01-31 NOTE — Progress Notes (Signed)

## 2017-02-28 DIAGNOSIS — F4320 Adjustment disorder, unspecified: Secondary | ICD-10-CM | POA: Diagnosis not present

## 2017-03-03 DIAGNOSIS — F4320 Adjustment disorder, unspecified: Secondary | ICD-10-CM | POA: Diagnosis not present

## 2017-03-20 DIAGNOSIS — F4320 Adjustment disorder, unspecified: Secondary | ICD-10-CM | POA: Diagnosis not present

## 2017-03-28 DIAGNOSIS — F4320 Adjustment disorder, unspecified: Secondary | ICD-10-CM | POA: Diagnosis not present

## 2017-04-04 DIAGNOSIS — N760 Acute vaginitis: Secondary | ICD-10-CM | POA: Diagnosis not present

## 2017-04-04 DIAGNOSIS — N766 Ulceration of vulva: Secondary | ICD-10-CM | POA: Diagnosis not present

## 2017-04-09 DIAGNOSIS — Z113 Encounter for screening for infections with a predominantly sexual mode of transmission: Secondary | ICD-10-CM | POA: Diagnosis not present

## 2017-04-11 DIAGNOSIS — F4320 Adjustment disorder, unspecified: Secondary | ICD-10-CM | POA: Diagnosis not present

## 2017-04-11 MED FILL — DULoxetine HCL 30 MG CPEP: 30 | 18 days supply | Qty: 30 | Fill #0

## 2017-05-09 DIAGNOSIS — F4320 Adjustment disorder, unspecified: Secondary | ICD-10-CM | POA: Diagnosis not present

## 2017-05-21 DIAGNOSIS — F4320 Adjustment disorder, unspecified: Secondary | ICD-10-CM | POA: Diagnosis not present

## 2017-05-23 MED FILL — DULoxetine HCL 30 MG CPEP: 30 | 90 days supply | Qty: 270 | Fill #0

## 2017-06-11 DIAGNOSIS — F4320 Adjustment disorder, unspecified: Secondary | ICD-10-CM | POA: Diagnosis not present

## 2017-06-17 ENCOUNTER — Other Ambulatory Visit: Payer: Self-pay

## 2017-06-17 ENCOUNTER — Ambulatory Visit: Payer: 59 | Admitting: Vascular Surgery

## 2017-06-17 ENCOUNTER — Encounter: Payer: Self-pay | Admitting: Vascular Surgery

## 2017-06-17 VITALS — BP 107/73 | HR 60 | Resp 18 | Ht 67.0 in | Wt 187.0 lb

## 2017-06-17 DIAGNOSIS — I781 Nevus, non-neoplastic: Secondary | ICD-10-CM | POA: Diagnosis not present

## 2017-06-17 NOTE — Progress Notes (Signed)
Subjective:     Patient ID: Diana Rangel, female   DOB: 03/08/1971, 47 y.o.   MRN: [id number]  HPI This 47 year old female has noted some vein abnormalities in her right shin area for the past few years.  She did have it evaluated at [facility] a few years ago but did not have any treatment at that time.  She did have an ultrasound results are unknown.  She has no history of DVT thrombophlebitis venous stasis ulcers or bleeding.  She does not develop swelling as the day progresses.  She has no pain and wears no elastic compression stockings.  She has no symptoms in the left leg.  Past Medical History:  Diagnosis Date  . Depression     Social History   Tobacco Use  . Smoking status: Never Smoker  . Smokeless tobacco: Never Used  Substance Use Topics  . Alcohol use: Yes    Comment: socially    History reviewed. No pertinent family history.  No Known Allergies   Current Outpatient Medications:  .  B Complex Vitamins (B COMPLEX PO), Take 1 tablet by mouth daily., Disp: , Rfl:  .  CALCIUM PO, Take 1 tablet by mouth 2 (two) times daily., Disp: , Rfl:  .  DULoxetine (CYMBALTA) 30 MG capsule, , Disp: , Rfl:  .  Multiple Vitamin (MULTIVITAMIN WITH MINERALS) TABS, Take 1 tablet by mouth daily., Disp: , Rfl:  .  vitamin E 400 UNIT capsule, Take 800 Units by mouth daily., Disp: , Rfl:  .  amoxicillin-clavulanate (AUGMENTIN) 875-125 MG per tablet, Take 1 tablet by mouth 2 (two) times daily. (Patient not taking: Reported on 06/17/2017), Disp: 20 tablet, Rfl: 0 .  azithromycin (ZITHROMAX Z-PAK) 250 MG tablet, As directed (Patient not taking: Reported on 06/17/2017), Disp: 1 each, Rfl: 0 .  azithromycin (ZITHROMAX) 250 MG tablet, Take 2 tabs now then 1 daily times 4 days (Patient not taking: Reported on 06/17/2017), Disp: 6 tablet, Rfl: 0 .  benzonatate (TESSALON PERLES) 100 MG capsule, Take 1 capsule (100 mg total) by mouth 3 (three) times daily as needed for cough. (Patient not taking: Reported  on 06/17/2017), Disp: 20 capsule, Rfl: 0 .  benzonatate (TESSALON PERLES) 100 MG capsule, Take 1-2 capsules (100-200 mg total) every 8 (eight) hours as needed by mouth. (Patient not taking: Reported on 06/17/2017), Disp: 30 capsule, Rfl: 0 .  diphenoxylate-atropine (LOMOTIL) 2.5-0.025 MG per tablet, Take 1 tablet by mouth 4 (four) times daily as needed for diarrhea or loose stools. (Patient not taking: Reported on 06/17/2017), Disp: 24 tablet, Rfl: 0 .  FLUoxetine (PROZAC) 20 MG tablet, Take 20 mg by mouth daily., Disp: , Rfl:  .  Glucos-MSM-C-Mn-Ginger-Willow (GLUCOSAMINE MSM COMPLEX PO), Take 1 tablet by mouth 2 (two) times daily., Disp: , Rfl:  .  hydroquinone 2 % cream, Apply 1 application topically daily., Disp: , Rfl:  .  ibuprofen (ADVIL,MOTRIN) 200 MG tablet, Take 400 mg by mouth every 6 (six) hours as needed for pain., Disp: , Rfl:  .  ibuprofen (MOTRIN IB) 200 MG tablet, Take 3 tablets (600 mg total) by mouth every 6 (six) hours as needed for pain. (Patient not taking: Reported on 06/17/2017), Disp: 30 tablet, Rfl: 0 .  Omega-3 Fatty Acids (FISH OIL PO), Take 2 capsules by mouth 2 (two) times daily., Disp: , Rfl:  .  ondansetron (ZOFRAN ODT) 8 MG disintegrating tablet, Take 1 tablet (8 mg total) by mouth every 8 (eight) hours as needed for nausea. (  Patient not taking: Reported on 06/17/2017), Disp: 24 tablet, Rfl: 0 .  oxyCODONE-acetaminophen (PERCOCET/ROXICET) 5-325 MG per tablet, Take 1-2 tablets by mouth every 4 (four) hours as needed for pain. (Patient not taking: Reported on 06/17/2017), Disp: 15 tablet, Rfl: 0 .  predniSONE (DELTASONE) 5 MG tablet, Take 1 tablet (5 mg total) as directed by mouth. sterapred generic taper (Patient not taking: Reported on 06/17/2017), Disp: 21 tablet, Rfl: 0  Vitals:   06/17/17 0856  BP: 107/73  Pulse: 60  Resp: 18  SpO2: 99%  Weight: 187 lb (84.8 kg)  Height: 5\' 7"  (1.702 m)    Body mass index is 29.29 kg/m.         Review of Systems  Denies chest  pain, dyspnea on exertion, PND, orthopnea, hemoptysis, claudication Objective:   Physical Exam BP 107/73 (BP Location: Left Arm, Patient Position: Sitting, Cuff Size: Normal)   Pulse 60   Resp 18   Ht 5\' 7"  (1.702 m)   Wt 187 lb (84.8 kg)   SpO2 99%   BMI 29.29 kg/m     Gen.-alert and oriented x3 in no apparent distress HEENT normal for age Lungs no rhonchi or wheezing Cardiovascular regular rhythm no murmurs carotid pulses 3+ palpable no bruits audible Abdomen soft nontender no palpable masses Musculoskeletal free of  major deformities Skin clear -no rashes Neurologic normal Lower extremities 3+ femoral and dorsalis pedis pulses palpable bilaterally with no edema Right leg has small blush of spider telangiectasia in the pretibial region midway between the knee and the malleoli.  No reticular veins, bulging varicose veins, edema, hyperpigmentation, or ulceration noted.  No spider veins noted on the left leg.  Today I performed a bedside SonoSite ultrasound exam of the right leg.  The right great saphenous vein is marginally enlarged and does appear to have some reflux in the distal thigh but otherwise is unremarkable       Assessment:     Spider telangiectasia right pretibial area with normal-appearing great saphenous vein other than localized reflux in right distal thigh    Plan:     Discussed with patient potential for foam sclerotherapy or skin laser treatment and she will discuss this further with Thea Silversmith and consider treatment options

## 2017-08-18 MED FILL — DULoxetine HCL 60 MG CPEP: 60 | 30 days supply | Qty: 60 | Fill #0

## 2017-09-11 MED FILL — DULoxetine HCL 60 MG CPEP: 60 | 30 days supply | Qty: 60 | Fill #1

## 2017-10-16 MED FILL — DULoxetine HCL 60 MG CPEP: 60 | 90 days supply | Qty: 180 | Fill #0

## 2017-11-13 DIAGNOSIS — Z1231 Encounter for screening mammogram for malignant neoplasm of breast: Secondary | ICD-10-CM | POA: Diagnosis not present

## 2017-11-13 DIAGNOSIS — Z01419 Encounter for gynecological examination (general) (routine) without abnormal findings: Secondary | ICD-10-CM | POA: Diagnosis not present

## 2017-11-13 DIAGNOSIS — Z6829 Body mass index (BMI) 29.0-29.9, adult: Secondary | ICD-10-CM | POA: Diagnosis not present

## 2017-11-20 DIAGNOSIS — Z1321 Encounter for screening for nutritional disorder: Secondary | ICD-10-CM | POA: Diagnosis not present

## 2017-11-20 DIAGNOSIS — Z131 Encounter for screening for diabetes mellitus: Secondary | ICD-10-CM | POA: Diagnosis not present

## 2017-11-20 DIAGNOSIS — Z1329 Encounter for screening for other suspected endocrine disorder: Secondary | ICD-10-CM | POA: Diagnosis not present

## 2017-11-20 DIAGNOSIS — Z13220 Encounter for screening for lipoid disorders: Secondary | ICD-10-CM | POA: Diagnosis not present

## 2018-01-05 MED FILL — DULoxetine HCL 30 MG CPEP: 30 | 30 days supply | Qty: 90 | Fill #0

## 2018-02-10 MED FILL — SELEGILINE HCL 5 MG TABLET: 5 | 30 days supply | Qty: 150 | Fill #0

## 2018-03-03 MED FILL — SELEGILINE HCL 5 MG TABLET: 5 | 30 days supply | Qty: 300 | Fill #0

## 2018-05-25 MED FILL — FLUoxetine HCL 20 MG CAPS: 20 | 90 days supply | Qty: 270 | Fill #0

## 2018-08-16 MED FILL — FLUoxetine HCL 20 MG CAPS: 20 | 90 days supply | Qty: 270 | Fill #1

## 2018-11-02 DIAGNOSIS — L821 Other seborrheic keratosis: Secondary | ICD-10-CM | POA: Diagnosis not present

## 2018-11-02 DIAGNOSIS — D225 Melanocytic nevi of trunk: Secondary | ICD-10-CM | POA: Diagnosis not present

## 2018-11-02 DIAGNOSIS — L814 Other melanin hyperpigmentation: Secondary | ICD-10-CM | POA: Diagnosis not present

## 2018-11-02 DIAGNOSIS — L811 Chloasma: Secondary | ICD-10-CM | POA: Diagnosis not present

## 2018-11-02 DIAGNOSIS — L739 Follicular disorder, unspecified: Secondary | ICD-10-CM | POA: Diagnosis not present

## 2018-11-02 MED FILL — CLINDAMYCIN PH 1% SOLUTION: 1 | 30 days supply | Qty: 60 | Fill #0

## 2018-11-11 MED FILL — FLUoxetine HCL 20 MG CAPS: 20 | 90 days supply | Qty: 270 | Fill #0

## 2018-11-16 MED FILL — CLINDAMYCIN PH 1% SOLUTION: 1 | 30 days supply | Qty: 60 | Fill #0

## 2019-01-01 DIAGNOSIS — Z6830 Body mass index (BMI) 30.0-30.9, adult: Secondary | ICD-10-CM | POA: Diagnosis not present

## 2019-01-01 DIAGNOSIS — Z1231 Encounter for screening mammogram for malignant neoplasm of breast: Secondary | ICD-10-CM | POA: Diagnosis not present

## 2019-01-01 DIAGNOSIS — Z01419 Encounter for gynecological examination (general) (routine) without abnormal findings: Secondary | ICD-10-CM | POA: Diagnosis not present

## 2019-01-05 ENCOUNTER — Other Ambulatory Visit: Payer: Self-pay | Admitting: Obstetrics and Gynecology

## 2019-01-05 DIAGNOSIS — R928 Other abnormal and inconclusive findings on diagnostic imaging of breast: Secondary | ICD-10-CM

## 2019-01-13 ENCOUNTER — Ambulatory Visit
Admission: RE | Admit: 2019-01-13 | Discharge: 2019-01-13 | Disposition: A | Discharge status: Home / Self Care | Payer: 59 | Source: Ambulatory Visit | Attending: Obstetrics and Gynecology | Admitting: Obstetrics and Gynecology

## 2019-01-13 ENCOUNTER — Other Ambulatory Visit: Payer: Self-pay

## 2019-01-13 ENCOUNTER — Other Ambulatory Visit: Payer: Self-pay | Admitting: Obstetrics and Gynecology

## 2019-01-13 DIAGNOSIS — R922 Inconclusive mammogram: Secondary | ICD-10-CM | POA: Diagnosis not present

## 2019-01-13 DIAGNOSIS — R928 Other abnormal and inconclusive findings on diagnostic imaging of breast: Secondary | ICD-10-CM

## 2019-01-13 DIAGNOSIS — N6322 Unspecified lump in the left breast, upper inner quadrant: Secondary | ICD-10-CM | POA: Diagnosis not present

## 2019-01-13 DIAGNOSIS — N6002 Solitary cyst of left breast: Secondary | ICD-10-CM

## 2019-01-21 DIAGNOSIS — Z1329 Encounter for screening for other suspected endocrine disorder: Secondary | ICD-10-CM | POA: Diagnosis not present

## 2019-01-21 DIAGNOSIS — Z1321 Encounter for screening for nutritional disorder: Secondary | ICD-10-CM | POA: Diagnosis not present

## 2019-01-21 DIAGNOSIS — Z13220 Encounter for screening for lipoid disorders: Secondary | ICD-10-CM | POA: Diagnosis not present

## 2019-01-21 DIAGNOSIS — Z131 Encounter for screening for diabetes mellitus: Secondary | ICD-10-CM | POA: Diagnosis not present

## 2019-02-16 MED FILL — FLUoxetine HCL 20 MG CAPS: 20 | 90 days supply | Qty: 270 | Fill #1

## 2019-03-05 MED FILL — buPROPion HCL ER (XL) 150 M: 150 | 17 days supply | Qty: 30 | Fill #0

## 2019-03-15 MED FILL — buPROPion HCL ER (XL) 150 M: 150 | 90 days supply | Qty: 270 | Fill #0

## 2019-05-31 MED FILL — FLUoxetine HCL 20 MG CAPS: 20 | 90 days supply | Qty: 270 | Fill #2

## 2019-06-01 MED FILL — CONCERTA 54 MG TABLET ER: 54 | 30 days supply | Qty: 30 | Fill #0

## 2019-06-11 MED FILL — CONCERTA 36 MG TABLET ER: 36 | 30 days supply | Qty: 30 | Fill #0

## 2019-06-16 DIAGNOSIS — M2240 Chondromalacia patellae, unspecified knee: Secondary | ICD-10-CM | POA: Diagnosis not present

## 2019-06-16 DIAGNOSIS — M545 Low back pain: Secondary | ICD-10-CM | POA: Diagnosis not present

## 2019-07-12 MED FILL — VYVANSE 40 MG CAPSULE: 40 | 30 days supply | Qty: 30 | Fill #0

## 2019-07-15 ENCOUNTER — Other Ambulatory Visit: Payer: Self-pay

## 2019-07-15 ENCOUNTER — Ambulatory Visit
Admission: RE | Admit: 2019-07-15 | Discharge: 2019-07-15 | Disposition: A | Discharge status: Home / Self Care | Payer: 59 | Source: Ambulatory Visit | Attending: Obstetrics and Gynecology | Admitting: Obstetrics and Gynecology

## 2019-07-15 ENCOUNTER — Other Ambulatory Visit: Payer: Self-pay | Admitting: Obstetrics and Gynecology

## 2019-07-15 DIAGNOSIS — N6002 Solitary cyst of left breast: Secondary | ICD-10-CM

## 2019-07-15 DIAGNOSIS — R922 Inconclusive mammogram: Secondary | ICD-10-CM | POA: Diagnosis not present

## 2019-09-01 MED FILL — FLUoxetine HCL 20 MG CAPS: 20 | 90 days supply | Qty: 270 | Fill #3

## 2019-12-02 ENCOUNTER — Other Ambulatory Visit (HOSPITAL_COMMUNITY): Payer: Self-pay | Admitting: Psychiatry

## 2019-12-02 MED FILL — FLUoxetine HCL 20 MG CAPS: 20 | 90 days supply | Qty: 270 | Fill #0

## 2020-01-03 ENCOUNTER — Other Ambulatory Visit: Payer: Self-pay

## 2020-01-03 ENCOUNTER — Ambulatory Visit
Admission: RE | Admit: 2020-01-03 | Discharge: 2020-01-03 | Disposition: A | Discharge status: Home / Self Care | Payer: 59 | Source: Ambulatory Visit | Attending: Obstetrics and Gynecology | Admitting: Obstetrics and Gynecology

## 2020-01-03 ENCOUNTER — Ambulatory Visit: Payer: 59

## 2020-01-03 DIAGNOSIS — N6002 Solitary cyst of left breast: Secondary | ICD-10-CM

## 2020-01-10 MED FILL — NORTRIPTYLINE HCL CAP 25 MG: 25 | 30 days supply | Qty: 53 | Fill #0

## 2020-01-19 ENCOUNTER — Other Ambulatory Visit: Payer: Self-pay | Admitting: Psychiatry

## 2020-01-19 DIAGNOSIS — F331 Major depressive disorder, recurrent, moderate: Secondary | ICD-10-CM | POA: Diagnosis not present

## 2020-01-21 LAB — NORTRIPTYLINE (AVENTYL), SERUM: Nortriptyline (Aventyl), Serum: 52 ng/mL (ref 50–150)

## 2020-02-03 ENCOUNTER — Other Ambulatory Visit (HOSPITAL_COMMUNITY): Payer: Self-pay | Admitting: Psychiatry

## 2020-02-04 MED FILL — NORTRIPTYLINE HCL CAP 25 MG: 25 | 30 days supply | Qty: 90 | Fill #0

## 2020-02-24 DIAGNOSIS — Z6831 Body mass index (BMI) 31.0-31.9, adult: Secondary | ICD-10-CM | POA: Diagnosis not present

## 2020-02-24 DIAGNOSIS — Z01419 Encounter for gynecological examination (general) (routine) without abnormal findings: Secondary | ICD-10-CM | POA: Diagnosis not present

## 2020-02-28 ENCOUNTER — Other Ambulatory Visit: Payer: Self-pay | Admitting: Obstetrics and Gynecology

## 2020-02-28 ENCOUNTER — Ambulatory Visit
Admission: RE | Admit: 2020-02-28 | Discharge: 2020-02-28 | Disposition: A | Discharge status: Home / Self Care | Payer: 59 | Source: Ambulatory Visit | Attending: Obstetrics and Gynecology | Admitting: Obstetrics and Gynecology

## 2020-02-28 ENCOUNTER — Ambulatory Visit (HOSPITAL_COMMUNITY): Payer: 59

## 2020-02-28 ENCOUNTER — Other Ambulatory Visit: Payer: Self-pay

## 2020-02-28 DIAGNOSIS — N6320 Unspecified lump in the left breast, unspecified quadrant: Secondary | ICD-10-CM

## 2020-02-28 DIAGNOSIS — N6310 Unspecified lump in the right breast, unspecified quadrant: Secondary | ICD-10-CM

## 2020-02-28 DIAGNOSIS — N6002 Solitary cyst of left breast: Secondary | ICD-10-CM

## 2020-02-28 DIAGNOSIS — N6322 Unspecified lump in the left breast, upper inner quadrant: Secondary | ICD-10-CM | POA: Diagnosis not present

## 2020-02-28 DIAGNOSIS — N6001 Solitary cyst of right breast: Secondary | ICD-10-CM

## 2020-02-28 DIAGNOSIS — N6321 Unspecified lump in the left breast, upper outer quadrant: Secondary | ICD-10-CM | POA: Diagnosis not present

## 2020-02-28 DIAGNOSIS — R922 Inconclusive mammogram: Secondary | ICD-10-CM | POA: Diagnosis not present

## 2020-03-22 ENCOUNTER — Other Ambulatory Visit: Payer: Self-pay

## 2020-03-22 ENCOUNTER — Ambulatory Visit
Admission: RE | Admit: 2020-03-22 | Discharge: 2020-03-22 | Disposition: A | Discharge status: Home / Self Care | Payer: 59 | Source: Ambulatory Visit | Attending: Obstetrics and Gynecology | Admitting: Obstetrics and Gynecology

## 2020-03-22 DIAGNOSIS — N6310 Unspecified lump in the right breast, unspecified quadrant: Secondary | ICD-10-CM

## 2020-03-31 MED FILL — FLUoxetine HCL 20 MG CAPS: 20 | 90 days supply | Qty: 270 | Fill #1

## 2020-06-20 ENCOUNTER — Other Ambulatory Visit (HOSPITAL_COMMUNITY): Payer: Self-pay

## 2020-06-20 DIAGNOSIS — L723 Sebaceous cyst: Secondary | ICD-10-CM | POA: Diagnosis not present

## 2020-06-20 DIAGNOSIS — L811 Chloasma: Secondary | ICD-10-CM | POA: Diagnosis not present

## 2020-06-20 MED ORDER — TRANEXAMIC ACID 650 MG PO TABS
ORAL_TABLET | ORAL | 2 refills | Status: DC
  Filled 2020-06-20: qty 30, 30d supply, fill #0
  Filled 2020-07-23: qty 30, 30d supply, fill #1
  Filled 2020-08-28: qty 30, 30d supply, fill #2

## 2020-06-21 ENCOUNTER — Other Ambulatory Visit (HOSPITAL_COMMUNITY): Payer: Self-pay

## 2020-06-21 MED FILL — Fluoxetine HCl Cap 20 MG: ORAL | 90 days supply | Qty: 270 | Fill #0 | Status: AC

## 2020-06-22 ENCOUNTER — Other Ambulatory Visit (HOSPITAL_COMMUNITY): Payer: Self-pay

## 2020-07-24 ENCOUNTER — Other Ambulatory Visit (HOSPITAL_COMMUNITY): Payer: Self-pay

## 2020-08-29 ENCOUNTER — Other Ambulatory Visit (HOSPITAL_COMMUNITY): Payer: Self-pay

## 2020-09-22 ENCOUNTER — Other Ambulatory Visit (HOSPITAL_COMMUNITY): Payer: Self-pay

## 2020-09-22 MED ORDER — AMPHETAMINE-DEXTROAMPHET ER 15 MG PO CP24
ORAL_CAPSULE | ORAL | 0 refills | Status: AC
  Filled 2020-09-22: qty 30, 30d supply, fill #0

## 2020-09-26 ENCOUNTER — Other Ambulatory Visit (HOSPITAL_COMMUNITY): Payer: Self-pay

## 2020-09-26 MED FILL — Fluoxetine HCl Cap 20 MG: ORAL | 90 days supply | Qty: 270 | Fill #1 | Status: AC

## 2020-09-27 ENCOUNTER — Other Ambulatory Visit (HOSPITAL_COMMUNITY): Payer: Self-pay

## 2020-09-27 MED ORDER — TRANEXAMIC ACID 650 MG PO TABS
ORAL_TABLET | ORAL | 0 refills | Status: DC
  Filled 2020-09-27: qty 30, 30d supply, fill #0

## 2020-09-28 ENCOUNTER — Other Ambulatory Visit (HOSPITAL_COMMUNITY): Payer: Self-pay

## 2020-11-08 ENCOUNTER — Other Ambulatory Visit (HOSPITAL_COMMUNITY): Payer: Self-pay

## 2020-11-08 DIAGNOSIS — L811 Chloasma: Secondary | ICD-10-CM | POA: Diagnosis not present

## 2020-11-08 MED ORDER — TRANEXAMIC ACID 650 MG PO TABS
ORAL_TABLET | ORAL | 4 refills | Status: DC
  Filled 2020-11-08: qty 15, 15d supply, fill #0
  Filled 2020-11-26: qty 15, 15d supply, fill #1
  Filled 2020-12-12: qty 15, 15d supply, fill #2
  Filled 2020-12-27: qty 15, 15d supply, fill #3
  Filled 2021-01-18: qty 15, 15d supply, fill #4

## 2020-11-21 DIAGNOSIS — Z20822 Contact with and (suspected) exposure to covid-19: Secondary | ICD-10-CM | POA: Diagnosis not present

## 2020-11-27 ENCOUNTER — Other Ambulatory Visit (HOSPITAL_COMMUNITY): Payer: Self-pay

## 2020-12-12 ENCOUNTER — Other Ambulatory Visit (HOSPITAL_COMMUNITY): Payer: Self-pay

## 2020-12-28 ENCOUNTER — Other Ambulatory Visit (HOSPITAL_COMMUNITY): Payer: Self-pay

## 2020-12-28 MED ORDER — FLUOXETINE HCL 20 MG PO CAPS
ORAL_CAPSULE | ORAL | 1 refills | Status: DC
  Filled 2020-12-28: qty 270, 90d supply, fill #0
  Filled 2021-03-14: qty 270, 90d supply, fill #1

## 2021-01-18 ENCOUNTER — Other Ambulatory Visit (HOSPITAL_COMMUNITY): Payer: Self-pay

## 2021-02-01 ENCOUNTER — Other Ambulatory Visit (HOSPITAL_COMMUNITY): Payer: Self-pay

## 2021-02-02 ENCOUNTER — Other Ambulatory Visit (HOSPITAL_COMMUNITY): Payer: Self-pay

## 2021-02-06 ENCOUNTER — Other Ambulatory Visit: Payer: Self-pay | Admitting: Obstetrics and Gynecology

## 2021-02-06 DIAGNOSIS — N6320 Unspecified lump in the left breast, unspecified quadrant: Secondary | ICD-10-CM

## 2021-02-07 ENCOUNTER — Other Ambulatory Visit (HOSPITAL_COMMUNITY): Payer: Self-pay

## 2021-02-07 MED ORDER — TRANEXAMIC ACID 650 MG PO TABS
ORAL_TABLET | ORAL | 1 refills | Status: DC
  Filled 2021-02-07: qty 15, 15d supply, fill #0
  Filled 2021-02-23: qty 15, 15d supply, fill #1

## 2021-02-14 ENCOUNTER — Other Ambulatory Visit (HOSPITAL_COMMUNITY): Payer: Self-pay

## 2021-02-14 MED ORDER — TRANEXAMIC ACID 650 MG PO TABS
ORAL_TABLET | ORAL | 1 refills | Status: DC
  Filled 2021-02-14: qty 15, 15d supply, fill #0
  Filled 2021-03-14: qty 15, 15d supply, fill #1

## 2021-02-19 ENCOUNTER — Other Ambulatory Visit (HOSPITAL_COMMUNITY): Payer: Self-pay

## 2021-02-23 ENCOUNTER — Other Ambulatory Visit (HOSPITAL_COMMUNITY): Payer: Self-pay

## 2021-02-26 ENCOUNTER — Other Ambulatory Visit (HOSPITAL_COMMUNITY): Payer: Self-pay

## 2021-03-15 ENCOUNTER — Other Ambulatory Visit (HOSPITAL_COMMUNITY): Payer: Self-pay

## 2021-03-21 ENCOUNTER — Other Ambulatory Visit: Payer: Self-pay | Admitting: Obstetrics and Gynecology

## 2021-03-21 ENCOUNTER — Ambulatory Visit
Admission: RE | Admit: 2021-03-21 | Discharge: 2021-03-21 | Disposition: A | Discharge status: Home / Self Care | Payer: 59 | Source: Ambulatory Visit | Attending: Obstetrics and Gynecology | Admitting: Obstetrics and Gynecology

## 2021-03-21 DIAGNOSIS — N6310 Unspecified lump in the right breast, unspecified quadrant: Secondary | ICD-10-CM

## 2021-03-21 DIAGNOSIS — N6320 Unspecified lump in the left breast, unspecified quadrant: Secondary | ICD-10-CM

## 2021-03-21 DIAGNOSIS — N6322 Unspecified lump in the left breast, upper inner quadrant: Secondary | ICD-10-CM | POA: Diagnosis not present

## 2021-03-21 DIAGNOSIS — N6489 Other specified disorders of breast: Secondary | ICD-10-CM | POA: Diagnosis not present

## 2021-03-21 DIAGNOSIS — N6321 Unspecified lump in the left breast, upper outer quadrant: Secondary | ICD-10-CM | POA: Diagnosis not present

## 2021-03-21 DIAGNOSIS — R922 Inconclusive mammogram: Secondary | ICD-10-CM | POA: Diagnosis not present

## 2021-04-03 ENCOUNTER — Other Ambulatory Visit (HOSPITAL_COMMUNITY): Payer: Self-pay

## 2021-04-04 ENCOUNTER — Other Ambulatory Visit (HOSPITAL_COMMUNITY): Payer: Self-pay

## 2021-04-09 ENCOUNTER — Other Ambulatory Visit (HOSPITAL_COMMUNITY): Payer: Self-pay

## 2021-04-09 DIAGNOSIS — L811 Chloasma: Secondary | ICD-10-CM | POA: Diagnosis not present

## 2021-04-09 DIAGNOSIS — Z23 Encounter for immunization: Secondary | ICD-10-CM | POA: Diagnosis not present

## 2021-04-09 MED ORDER — TRANEXAMIC ACID 650 MG PO TABS
ORAL_TABLET | ORAL | 5 refills | Status: DC
  Filled 2021-04-09: qty 15, 15d supply, fill #0
  Filled 2021-04-28: qty 15, 45d supply, fill #1
  Filled 2021-04-30: qty 75, 75d supply, fill #1

## 2021-04-10 ENCOUNTER — Other Ambulatory Visit (HOSPITAL_COMMUNITY): Payer: Self-pay

## 2021-04-20 DIAGNOSIS — Z1151 Encounter for screening for human papillomavirus (HPV): Secondary | ICD-10-CM | POA: Diagnosis not present

## 2021-04-20 DIAGNOSIS — Z01419 Encounter for gynecological examination (general) (routine) without abnormal findings: Secondary | ICD-10-CM | POA: Diagnosis not present

## 2021-04-20 DIAGNOSIS — Z124 Encounter for screening for malignant neoplasm of cervix: Secondary | ICD-10-CM | POA: Diagnosis not present

## 2021-04-20 DIAGNOSIS — Z13220 Encounter for screening for lipoid disorders: Secondary | ICD-10-CM | POA: Diagnosis not present

## 2021-04-20 DIAGNOSIS — Z1321 Encounter for screening for nutritional disorder: Secondary | ICD-10-CM | POA: Diagnosis not present

## 2021-04-20 DIAGNOSIS — Z131 Encounter for screening for diabetes mellitus: Secondary | ICD-10-CM | POA: Diagnosis not present

## 2021-04-20 DIAGNOSIS — Z1329 Encounter for screening for other suspected endocrine disorder: Secondary | ICD-10-CM | POA: Diagnosis not present

## 2021-04-20 DIAGNOSIS — Z6829 Body mass index (BMI) 29.0-29.9, adult: Secondary | ICD-10-CM | POA: Diagnosis not present

## 2021-04-28 ENCOUNTER — Other Ambulatory Visit (HOSPITAL_COMMUNITY): Payer: Self-pay

## 2021-04-30 ENCOUNTER — Other Ambulatory Visit (HOSPITAL_COMMUNITY): Payer: Self-pay

## 2021-05-01 DIAGNOSIS — L720 Epidermal cyst: Secondary | ICD-10-CM | POA: Diagnosis not present

## 2021-05-01 DIAGNOSIS — L723 Sebaceous cyst: Secondary | ICD-10-CM | POA: Diagnosis not present

## 2021-06-26 ENCOUNTER — Other Ambulatory Visit (HOSPITAL_COMMUNITY): Payer: Self-pay

## 2021-06-26 MED ORDER — FLUOXETINE HCL 20 MG PO CAPS
60.0000 mg | ORAL_CAPSULE | Freq: Every day | ORAL | 1 refills | Status: DC
  Filled 2021-06-26: qty 270, 90d supply, fill #0
  Filled 2021-09-14: qty 270, 90d supply, fill #1

## 2021-06-27 ENCOUNTER — Other Ambulatory Visit (HOSPITAL_COMMUNITY): Payer: Self-pay

## 2021-07-19 ENCOUNTER — Other Ambulatory Visit (HOSPITAL_COMMUNITY): Payer: Self-pay

## 2021-07-26 ENCOUNTER — Other Ambulatory Visit (HOSPITAL_COMMUNITY): Payer: Self-pay

## 2021-07-26 MED ORDER — TRANEXAMIC ACID 650 MG PO TABS
ORAL_TABLET | ORAL | 0 refills | Status: DC
  Filled 2021-07-26: qty 90, 90d supply, fill #0

## 2021-07-27 ENCOUNTER — Other Ambulatory Visit (HOSPITAL_COMMUNITY): Payer: Self-pay

## 2021-09-14 ENCOUNTER — Other Ambulatory Visit (HOSPITAL_COMMUNITY): Payer: Self-pay

## 2021-09-25 DIAGNOSIS — L700 Acne vulgaris: Secondary | ICD-10-CM | POA: Diagnosis not present

## 2021-09-25 DIAGNOSIS — L811 Chloasma: Secondary | ICD-10-CM | POA: Diagnosis not present

## 2021-09-25 DIAGNOSIS — L680 Hirsutism: Secondary | ICD-10-CM | POA: Diagnosis not present

## 2021-10-03 ENCOUNTER — Other Ambulatory Visit (HOSPITAL_COMMUNITY): Payer: Self-pay

## 2021-10-03 MED ORDER — TRETINOIN 0.025 % EX CREA
TOPICAL_CREAM | CUTANEOUS | 3 refills | Status: AC
  Filled 2021-10-03 – 2021-10-30 (×2): qty 45, 30d supply, fill #0

## 2021-10-03 MED ORDER — TRANEXAMIC ACID 650 MG PO TABS
ORAL_TABLET | ORAL | 1 refills | Status: AC
  Filled 2021-10-03: qty 90, 90d supply, fill #0
  Filled 2022-06-18: qty 90, 90d supply, fill #1

## 2021-10-05 ENCOUNTER — Other Ambulatory Visit (HOSPITAL_COMMUNITY): Payer: Self-pay

## 2021-10-08 ENCOUNTER — Other Ambulatory Visit (HOSPITAL_COMMUNITY): Payer: Self-pay

## 2021-10-10 ENCOUNTER — Other Ambulatory Visit (HOSPITAL_COMMUNITY): Payer: Self-pay

## 2021-10-11 ENCOUNTER — Other Ambulatory Visit (HOSPITAL_COMMUNITY): Payer: Self-pay

## 2021-10-16 ENCOUNTER — Other Ambulatory Visit (HOSPITAL_COMMUNITY): Payer: Self-pay

## 2021-10-17 ENCOUNTER — Other Ambulatory Visit (HOSPITAL_COMMUNITY): Payer: Self-pay

## 2021-10-25 ENCOUNTER — Other Ambulatory Visit (HOSPITAL_COMMUNITY): Payer: Self-pay

## 2021-10-30 ENCOUNTER — Other Ambulatory Visit (HOSPITAL_COMMUNITY): Payer: Self-pay

## 2021-12-19 ENCOUNTER — Other Ambulatory Visit (HOSPITAL_COMMUNITY): Payer: Self-pay

## 2021-12-19 DIAGNOSIS — J029 Acute pharyngitis, unspecified: Secondary | ICD-10-CM | POA: Diagnosis not present

## 2021-12-19 DIAGNOSIS — J019 Acute sinusitis, unspecified: Secondary | ICD-10-CM | POA: Diagnosis not present

## 2021-12-19 DIAGNOSIS — J40 Bronchitis, not specified as acute or chronic: Secondary | ICD-10-CM | POA: Diagnosis not present

## 2021-12-19 DIAGNOSIS — R0981 Nasal congestion: Secondary | ICD-10-CM | POA: Diagnosis not present

## 2021-12-19 DIAGNOSIS — R059 Cough, unspecified: Secondary | ICD-10-CM | POA: Diagnosis not present

## 2021-12-19 MED ORDER — AMOXICILLIN-POT CLAVULANATE 875-125 MG PO TABS
1.0000 | ORAL_TABLET | Freq: Two times a day (BID) | ORAL | 0 refills | Status: DC
  Filled 2021-12-19: qty 14, 7d supply, fill #0

## 2022-01-09 DIAGNOSIS — J40 Bronchitis, not specified as acute or chronic: Secondary | ICD-10-CM | POA: Diagnosis not present

## 2022-01-09 DIAGNOSIS — J029 Acute pharyngitis, unspecified: Secondary | ICD-10-CM | POA: Diagnosis not present

## 2022-01-09 DIAGNOSIS — R059 Cough, unspecified: Secondary | ICD-10-CM | POA: Diagnosis not present

## 2022-01-10 ENCOUNTER — Other Ambulatory Visit (HOSPITAL_COMMUNITY): Payer: Self-pay

## 2022-01-10 MED ORDER — AMOXICILLIN-POT CLAVULANATE 875-125 MG PO TABS
1.0000 | ORAL_TABLET | Freq: Two times a day (BID) | ORAL | 0 refills | Status: DC
  Filled 2022-01-10: qty 14, 7d supply, fill #0

## 2022-01-10 MED ORDER — FLUOXETINE HCL 20 MG PO CAPS
60.0000 mg | ORAL_CAPSULE | Freq: Every day | ORAL | 3 refills | Status: AC
  Filled 2022-01-10: qty 270, 90d supply, fill #0
  Filled 2022-03-28: qty 270, 90d supply, fill #1
  Filled 2022-06-27: qty 270, 90d supply, fill #2
  Filled 2022-09-25: qty 270, 90d supply, fill #3

## 2022-02-19 DIAGNOSIS — L700 Acne vulgaris: Secondary | ICD-10-CM | POA: Diagnosis not present

## 2022-02-19 DIAGNOSIS — B078 Other viral warts: Secondary | ICD-10-CM | POA: Diagnosis not present

## 2022-02-19 DIAGNOSIS — L811 Chloasma: Secondary | ICD-10-CM | POA: Diagnosis not present

## 2022-02-19 DIAGNOSIS — L680 Hirsutism: Secondary | ICD-10-CM | POA: Diagnosis not present

## 2022-03-28 ENCOUNTER — Other Ambulatory Visit (HOSPITAL_COMMUNITY): Payer: Self-pay

## 2022-04-17 ENCOUNTER — Other Ambulatory Visit (HOSPITAL_COMMUNITY): Payer: Self-pay

## 2022-04-17 ENCOUNTER — Telehealth: Payer: Commercial Managed Care - PPO | Admitting: Physician Assistant

## 2022-04-17 DIAGNOSIS — R051 Acute cough: Secondary | ICD-10-CM | POA: Diagnosis not present

## 2022-04-17 MED ORDER — BENZONATATE 100 MG PO CAPS
100.0000 mg | ORAL_CAPSULE | Freq: Three times a day (TID) | ORAL | 0 refills | Status: AC | PRN
  Filled 2022-04-17: qty 30, 10d supply, fill #0

## 2022-04-17 MED ORDER — PROMETHAZINE-DM 6.25-15 MG/5ML PO SYRP
5.0000 mL | ORAL_SOLUTION | Freq: Four times a day (QID) | ORAL | 0 refills | Status: AC | PRN
  Filled 2022-04-17: qty 118, 6d supply, fill #0

## 2022-04-17 NOTE — Progress Notes (Signed)
We are sorry that you are not feeling well.  Here is how we plan to help!  Based on your presentation I believe you most likely have A cough due to a virus.  This is called viral bronchitis and is best treated by rest, plenty of fluids and control of the cough.  You may use Ibuprofen or Tylenol as directed to help your symptoms.     In addition you may use A prescription cough medication called Tessalon Perles '100mg'$ . You may take 1-2 capsules every 8 hours as needed for your cough. And Promethazine DM Take 15m every 6 hours as needed for cough. These can be used together. You can also take Mucinex (plain) with these as well for mucus congestion and drainage.   From your responses in the eVisit questionnaire you describe inflammation in the upper respiratory tract which is causing a significant cough.  This is commonly called Bronchitis and has four common causes:   Allergies Viral Infections Acid Reflux Bacterial Infection Allergies, viruses and acid reflux are treated by controlling symptoms or eliminating the cause. An example might be a cough caused by taking certain blood pressure medications. You stop the cough by changing the medication. Another example might be a cough caused by acid reflux. Controlling the reflux helps control the cough.  USE OF BRONCHODILATOR ("RESCUE") INHALERS: There is a risk from using your bronchodilator too frequently.  The risk is that over-reliance on a medication which only relaxes the muscles surrounding the breathing tubes can reduce the effectiveness of medications prescribed to reduce swelling and congestion of the tubes themselves.  Although you feel brief relief from the bronchodilator inhaler, your asthma may actually be worsening with the tubes becoming more swollen and filled with mucus.  This can delay other crucial treatments, such as oral steroid medications. If you need to use a bronchodilator inhaler daily, several times per day, you should discuss this  with your provider.  There are probably better treatments that could be used to keep your asthma under control.     HOME CARE Only take medications as instructed by your medical team. Complete the entire course of an antibiotic. Drink plenty of fluids and get plenty of rest. Avoid close contacts especially the very young and the elderly Cover your mouth if you cough or cough into your sleeve. Always remember to wash your hands A steam or ultrasonic humidifier can help congestion.   GET HELP RIGHT AWAY IF: You develop worsening fever. You become short of breath You cough up blood. Your symptoms persist after you have completed your treatment plan MAKE SURE YOU  Understand these instructions. Will watch your condition. Will get help right away if you are not doing well or get worse.    Thank you for choosing an e-visit.  Your e-visit answers were reviewed by a board certified advanced clinical practitioner to complete your personal care plan. Depending upon the condition, your plan could have included both over the counter or prescription medications.  Please review your pharmacy choice. Make sure the pharmacy is open so you can pick up prescription now. If there is a problem, you may contact your provider through [employer] have the prescription routed to another pharmacy.  Your safety is important to [place] If you have drug allergies check your prescription carefully.   For the next 24 hours you can use MyChart to ask questions about today's visit, request a non-urgent call back, or ask for a work or school excuse.  You will get an email in the next two days asking about your experience. I hope that your e-visit has been valuable and will speed your recovery.  I have spent 5 minutes in review of e-visit questionnaire, review and updating patient chart, medical decision making and response to patient.   Mar Daring, PA-C

## 2022-05-09 ENCOUNTER — Other Ambulatory Visit (HOSPITAL_COMMUNITY): Payer: Self-pay

## 2022-05-09 DIAGNOSIS — H8302 Labyrinthitis, left ear: Secondary | ICD-10-CM | POA: Diagnosis not present

## 2022-05-09 DIAGNOSIS — H66002 Acute suppurative otitis media without spontaneous rupture of ear drum, left ear: Secondary | ICD-10-CM | POA: Diagnosis not present

## 2022-05-09 MED ORDER — MECLIZINE HCL 25 MG PO TABS
25.0000 mg | ORAL_TABLET | Freq: Three times a day (TID) | ORAL | 0 refills | Status: AC | PRN
  Filled 2022-05-09 – 2022-05-10 (×2): qty 15, 5d supply, fill #0

## 2022-05-09 MED ORDER — AMOXICILLIN-POT CLAVULANATE 875-125 MG PO TABS
1.0000 | ORAL_TABLET | Freq: Two times a day (BID) | ORAL | 0 refills | Status: AC
  Filled 2022-05-09 – 2022-05-10 (×2): qty 14, 7d supply, fill #0

## 2022-05-10 ENCOUNTER — Other Ambulatory Visit: Payer: Self-pay

## 2022-05-10 ENCOUNTER — Other Ambulatory Visit (HOSPITAL_COMMUNITY): Payer: Self-pay

## 2022-05-28 DIAGNOSIS — Z6830 Body mass index (BMI) 30.0-30.9, adult: Secondary | ICD-10-CM | POA: Diagnosis not present

## 2022-05-28 DIAGNOSIS — Z13220 Encounter for screening for lipoid disorders: Secondary | ICD-10-CM | POA: Diagnosis not present

## 2022-05-28 DIAGNOSIS — Z1231 Encounter for screening mammogram for malignant neoplasm of breast: Secondary | ICD-10-CM | POA: Diagnosis not present

## 2022-05-28 DIAGNOSIS — Z13228 Encounter for screening for other metabolic disorders: Secondary | ICD-10-CM | POA: Diagnosis not present

## 2022-05-28 DIAGNOSIS — Z131 Encounter for screening for diabetes mellitus: Secondary | ICD-10-CM | POA: Diagnosis not present

## 2022-05-28 DIAGNOSIS — Z1211 Encounter for screening for malignant neoplasm of colon: Secondary | ICD-10-CM | POA: Diagnosis not present

## 2022-05-28 DIAGNOSIS — Z01419 Encounter for gynecological examination (general) (routine) without abnormal findings: Secondary | ICD-10-CM | POA: Diagnosis not present

## 2022-05-28 DIAGNOSIS — Z1321 Encounter for screening for nutritional disorder: Secondary | ICD-10-CM | POA: Diagnosis not present

## 2022-05-28 DIAGNOSIS — Z1329 Encounter for screening for other suspected endocrine disorder: Secondary | ICD-10-CM | POA: Diagnosis not present

## 2022-06-18 ENCOUNTER — Other Ambulatory Visit: Payer: Self-pay

## 2022-06-27 ENCOUNTER — Other Ambulatory Visit (HOSPITAL_COMMUNITY): Payer: Self-pay

## 2022-07-29 IMAGING — MG DIGITAL DIAGNOSTIC BILAT W/ TOMO W/ CAD
8 series · 8 of 24 positions shown · non-contrast
Comparison: Previous exam(s).

CLINICAL DATA: Two year follow-up of a 12 o'clock left breast mass.



[R CC synth-2D]
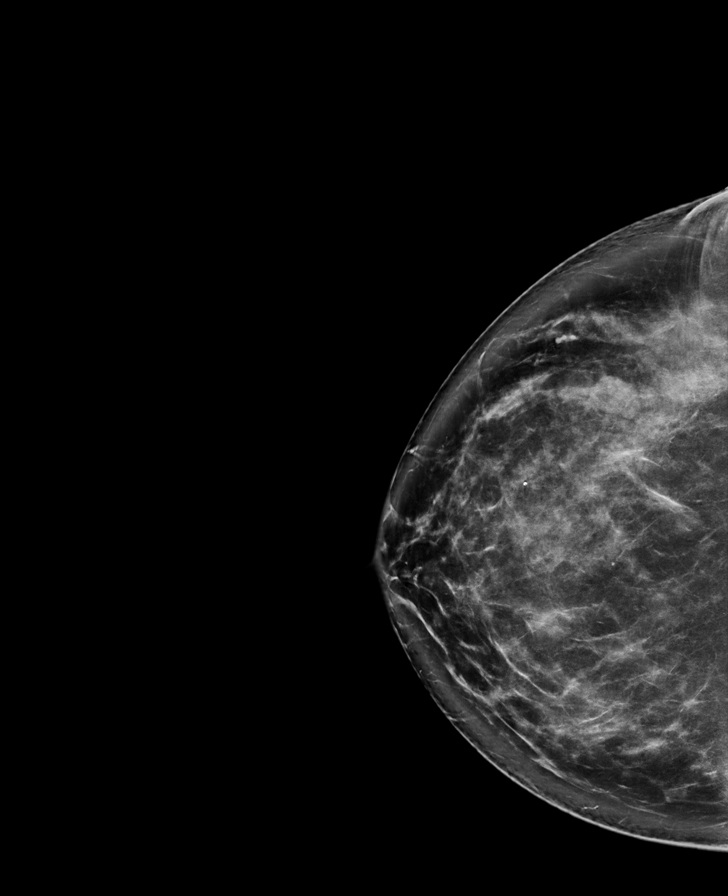

[L MLO synth-2D]
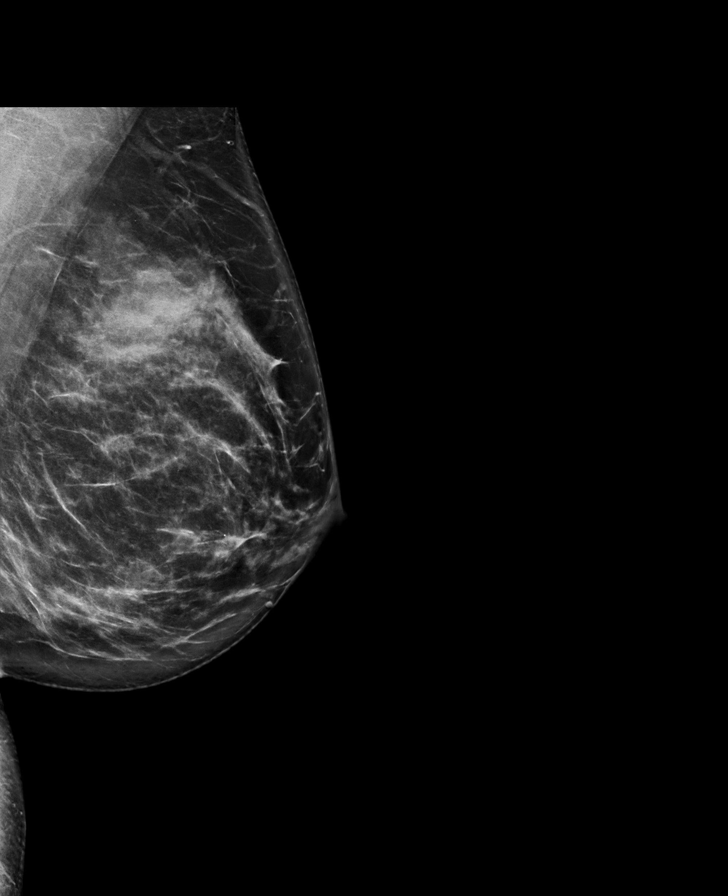

[L CC synth-2D]
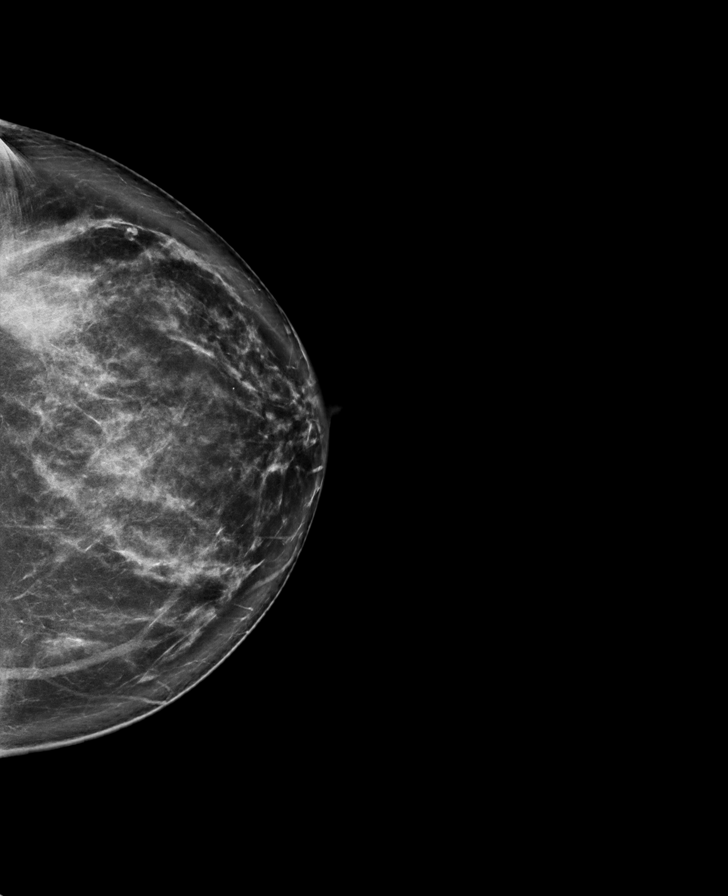

[R MLO synth-2D]
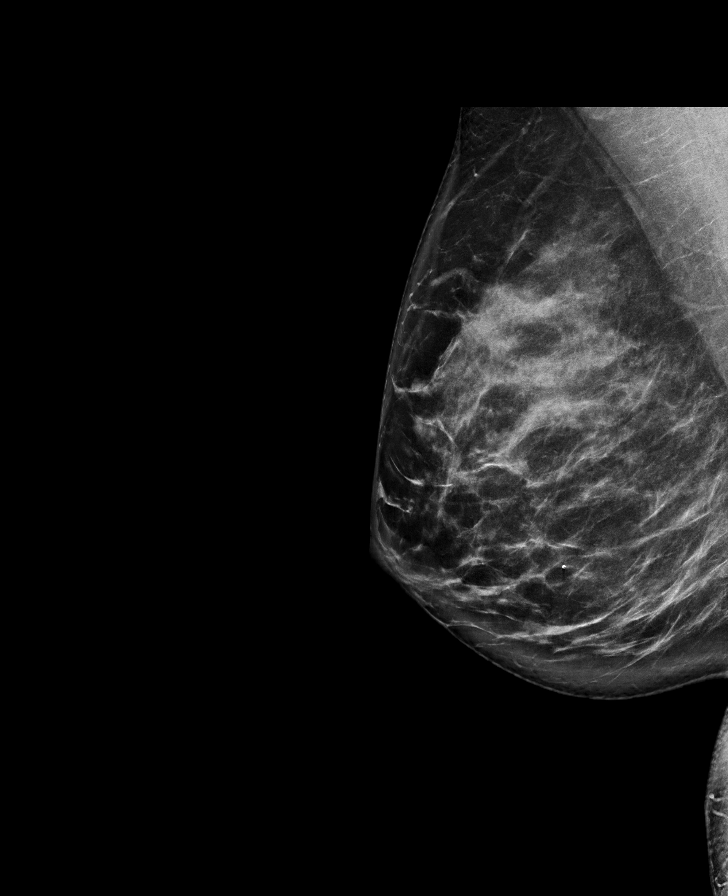

[L MLO tomo · tomo slice 48/95.0]
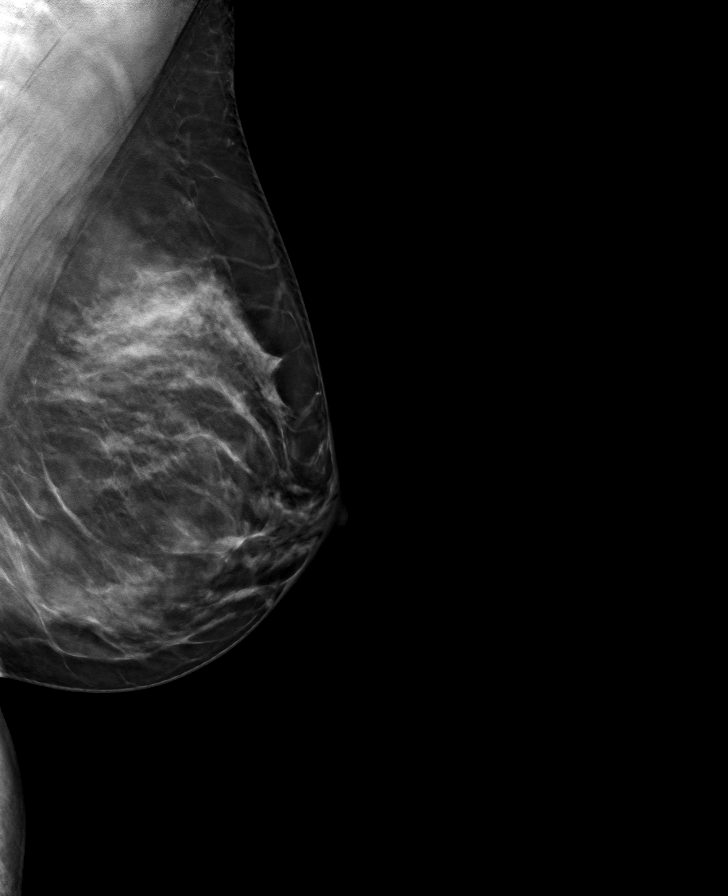

[R CC tomo · tomo slice 50/99.0]
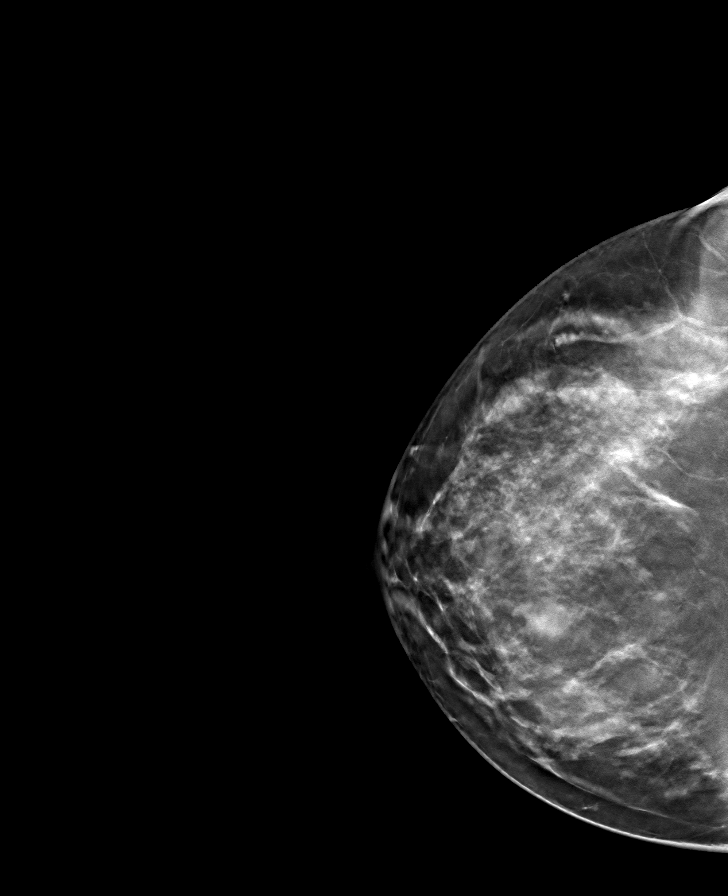

[R MLO tomo · tomo slice 51/101.0]
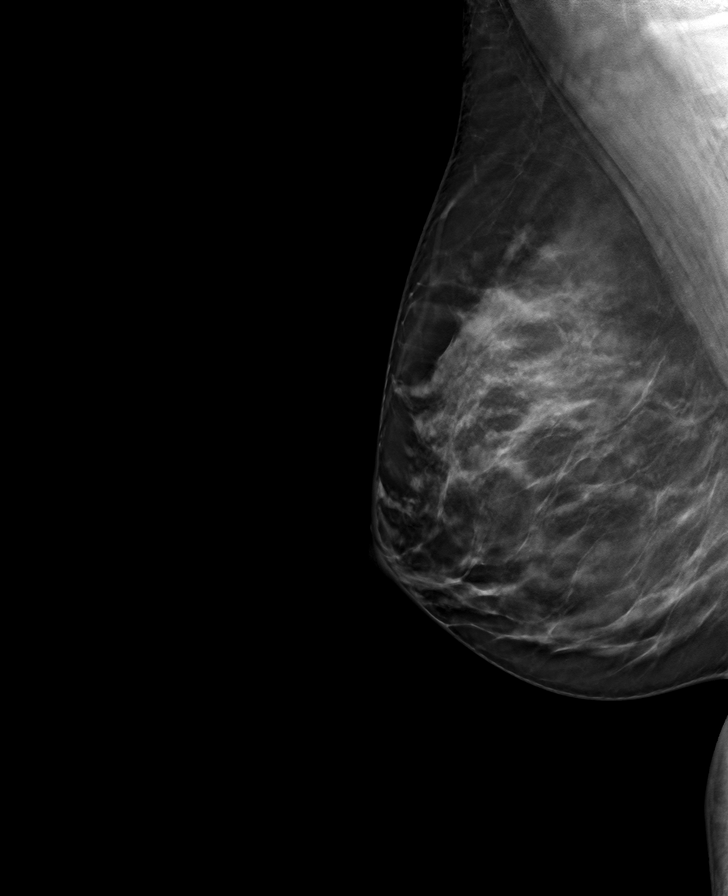

[L CC tomo · tomo slice 47/94.0]
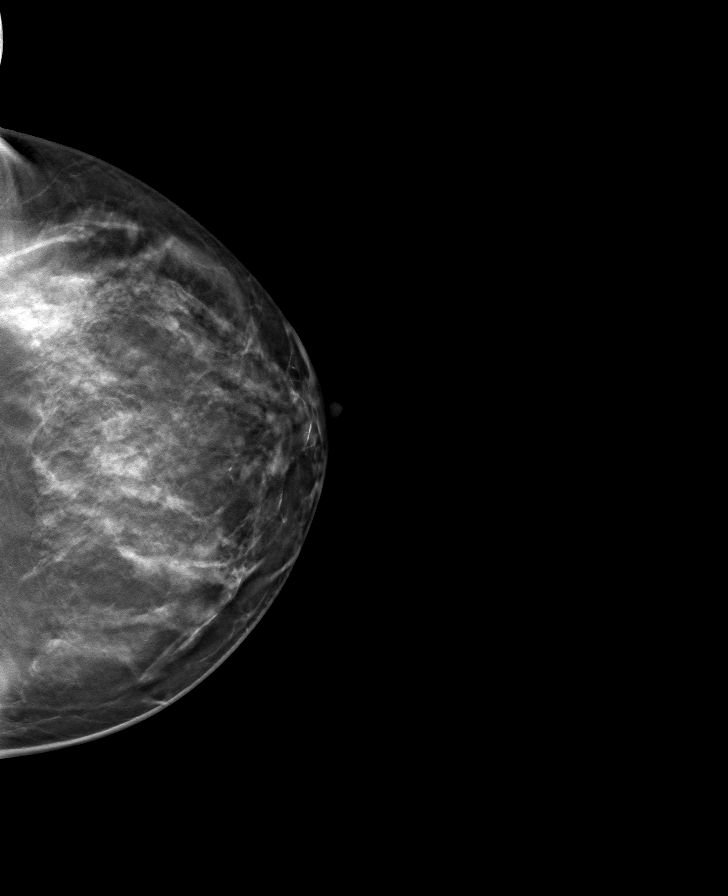

[8 of 24 positions shown; findings below may reference images not displayed]

ACR Breast Density Category c: The breast tissue is heterogeneously
dense, which may obscure small masses.
FINDINGS: There is a mass in the lateral right breast. There was a waxing
waning cystic pattern. No new masses seen in either breast. No
suspicious calcifications or distortion.

On physical exam, no suspicious lumps are identified.

Targeted ultrasound is performed, showing a stable mass in the left
breast at 12 o'clock, 6 cm from the nipple measuring 6 x 3 x 5 mm.
There is a cyst in the right breast at 9 o'clock accounting for the
mammographically identified mass.
IMPRESSION: Fibrocystic changes. The mass in the left breast at 12 o'clock is
been stable for 2 years, now considered benign. No evidence of
malignancy in either breast.

RECOMMENDATION:
Annual screening mammography.

I have discussed the findings and recommendations with the patient.
If applicable, a reminder letter will be sent to the patient
regarding the next appointment.

BI-RADS CATEGORY  2: Benign.

## 2022-10-09 ENCOUNTER — Other Ambulatory Visit (HOSPITAL_COMMUNITY): Payer: Self-pay

## 2022-10-09 DIAGNOSIS — L811 Chloasma: Secondary | ICD-10-CM | POA: Diagnosis not present

## 2022-10-09 MED ORDER — TRANEXAMIC ACID 650 MG PO TABS
ORAL_TABLET | Freq: Two times a day (BID) | ORAL | 1 refills | Status: AC
  Filled 2022-10-09 – 2022-12-10 (×3): qty 90, 90d supply, fill #0

## 2022-10-10 ENCOUNTER — Other Ambulatory Visit (HOSPITAL_COMMUNITY): Payer: Self-pay

## 2022-10-18 ENCOUNTER — Other Ambulatory Visit (HOSPITAL_COMMUNITY): Payer: Self-pay

## 2022-11-05 ENCOUNTER — Other Ambulatory Visit (HOSPITAL_COMMUNITY): Payer: Self-pay

## 2022-11-05 MED ORDER — BISACODYL 5 MG PO TBEC
DELAYED_RELEASE_TABLET | ORAL | 0 refills | Status: AC
  Filled 2022-11-05: qty 4, 1d supply, fill #0

## 2022-11-05 MED ORDER — PEG 3350-KCL-NA BICARB-NACL 420 G PO SOLR
ORAL | 0 refills | Status: AC
  Filled 2022-11-05: qty 4000, 1d supply, fill #0

## 2022-11-08 DIAGNOSIS — Z1211 Encounter for screening for malignant neoplasm of colon: Secondary | ICD-10-CM | POA: Diagnosis not present

## 2022-11-08 DIAGNOSIS — K644 Residual hemorrhoidal skin tags: Secondary | ICD-10-CM | POA: Diagnosis not present

## 2022-11-08 DIAGNOSIS — K648 Other hemorrhoids: Secondary | ICD-10-CM | POA: Diagnosis not present

## 2022-11-11 DIAGNOSIS — H1131 Conjunctival hemorrhage, right eye: Secondary | ICD-10-CM | POA: Diagnosis not present

## 2022-11-13 ENCOUNTER — Other Ambulatory Visit (HOSPITAL_COMMUNITY): Payer: Self-pay

## 2022-11-21 DIAGNOSIS — M5116 Intervertebral disc disorders with radiculopathy, lumbar region: Secondary | ICD-10-CM | POA: Diagnosis not present

## 2022-11-21 DIAGNOSIS — Z6830 Body mass index (BMI) 30.0-30.9, adult: Secondary | ICD-10-CM | POA: Diagnosis not present

## 2022-12-09 DIAGNOSIS — M1712 Unilateral primary osteoarthritis, left knee: Secondary | ICD-10-CM | POA: Diagnosis not present

## 2022-12-09 DIAGNOSIS — M170 Bilateral primary osteoarthritis of knee: Secondary | ICD-10-CM | POA: Diagnosis not present

## 2022-12-09 DIAGNOSIS — M1711 Unilateral primary osteoarthritis, right knee: Secondary | ICD-10-CM | POA: Diagnosis not present

## 2022-12-10 ENCOUNTER — Other Ambulatory Visit (HOSPITAL_COMMUNITY): Payer: Self-pay

## 2022-12-10 DIAGNOSIS — L739 Follicular disorder, unspecified: Secondary | ICD-10-CM | POA: Diagnosis not present

## 2022-12-10 MED ORDER — DOXYCYCLINE HYCLATE 100 MG PO CAPS
100.0000 mg | ORAL_CAPSULE | Freq: Two times a day (BID) | ORAL | 0 refills | Status: AC
  Filled 2022-12-10: qty 28, 14d supply, fill #0

## 2022-12-10 MED ORDER — CLINDAMYCIN PHOSPHATE 1 % EX GEL
CUTANEOUS | 2 refills | Status: AC
  Filled 2022-12-10 (×2): qty 30, 30d supply, fill #0

## 2022-12-26 DIAGNOSIS — M2242 Chondromalacia patellae, left knee: Secondary | ICD-10-CM | POA: Diagnosis not present

## 2022-12-26 DIAGNOSIS — M2241 Chondromalacia patellae, right knee: Secondary | ICD-10-CM | POA: Diagnosis not present

## 2023-01-15 DIAGNOSIS — M2241 Chondromalacia patellae, right knee: Secondary | ICD-10-CM | POA: Diagnosis not present

## 2023-01-15 DIAGNOSIS — M2242 Chondromalacia patellae, left knee: Secondary | ICD-10-CM | POA: Diagnosis not present

## 2023-01-16 ENCOUNTER — Other Ambulatory Visit (HOSPITAL_COMMUNITY): Payer: Self-pay

## 2023-01-16 MED ORDER — FLUOXETINE HCL 20 MG PO CAPS
60.0000 mg | ORAL_CAPSULE | Freq: Every day | ORAL | 1 refills | Status: AC
  Filled 2023-01-16: qty 270, 90d supply, fill #0
  Filled 2023-04-15: qty 270, 90d supply, fill #1

## 2023-01-22 DIAGNOSIS — M170 Bilateral primary osteoarthritis of knee: Secondary | ICD-10-CM | POA: Diagnosis not present

## 2023-02-07 DIAGNOSIS — M25562 Pain in left knee: Secondary | ICD-10-CM | POA: Diagnosis not present

## 2023-02-19 DIAGNOSIS — M25562 Pain in left knee: Secondary | ICD-10-CM | POA: Diagnosis not present

## 2023-02-25 ENCOUNTER — Other Ambulatory Visit: Payer: Self-pay

## 2023-02-25 ENCOUNTER — Other Ambulatory Visit (HOSPITAL_COMMUNITY): Payer: Self-pay

## 2023-02-25 DIAGNOSIS — L719 Rosacea, unspecified: Secondary | ICD-10-CM | POA: Diagnosis not present

## 2023-02-25 MED ORDER — METRONIDAZOLE 0.75 % EX CREA
TOPICAL_CREAM | CUTANEOUS | 3 refills | Status: AC
  Filled 2023-02-25: qty 45, 30d supply, fill #0

## 2023-02-25 MED ORDER — DOXYCYCLINE HYCLATE 20 MG PO TABS
20.0000 mg | ORAL_TABLET | Freq: Two times a day (BID) | ORAL | 0 refills | Status: AC
  Filled 2023-02-25: qty 10, 5d supply, fill #0
  Filled 2023-02-25: qty 170, 85d supply, fill #0

## 2023-02-25 MED ORDER — CLINDAMYCIN PHOSPHATE 1 % EX GEL
CUTANEOUS | 2 refills | Status: AC
  Filled 2023-02-25: qty 30, 10d supply, fill #0

## 2023-04-02 ENCOUNTER — Other Ambulatory Visit (HOSPITAL_COMMUNITY): Payer: Self-pay

## 2023-04-02 DIAGNOSIS — L719 Rosacea, unspecified: Secondary | ICD-10-CM | POA: Diagnosis not present

## 2023-04-02 DIAGNOSIS — L811 Chloasma: Secondary | ICD-10-CM | POA: Diagnosis not present

## 2023-04-02 MED ORDER — HYDROQUINONE 4 % EX CREA
1.0000 | TOPICAL_CREAM | Freq: Every day | CUTANEOUS | 0 refills | Status: DC
  Filled 2023-04-02: qty 28.35, 28d supply, fill #0

## 2023-04-02 MED ORDER — TRETINOIN 0.025 % EX CREA
1.0000 | TOPICAL_CREAM | Freq: Every evening | CUTANEOUS | 1 refills | Status: AC
  Filled 2023-04-02: qty 20, 20d supply, fill #0
  Filled 2024-03-08: qty 20, 20d supply, fill #1

## 2023-04-04 ENCOUNTER — Other Ambulatory Visit (HOSPITAL_COMMUNITY): Payer: Self-pay

## 2023-06-25 ENCOUNTER — Other Ambulatory Visit (HOSPITAL_COMMUNITY): Payer: Self-pay

## 2023-06-26 ENCOUNTER — Other Ambulatory Visit: Payer: Self-pay

## 2023-07-24 DIAGNOSIS — N951 Menopausal and female climacteric states: Secondary | ICD-10-CM | POA: Diagnosis not present

## 2023-07-24 DIAGNOSIS — Z1231 Encounter for screening mammogram for malignant neoplasm of breast: Secondary | ICD-10-CM | POA: Diagnosis not present

## 2023-07-24 DIAGNOSIS — Z131 Encounter for screening for diabetes mellitus: Secondary | ICD-10-CM | POA: Diagnosis not present

## 2023-07-24 DIAGNOSIS — Z1329 Encounter for screening for other suspected endocrine disorder: Secondary | ICD-10-CM | POA: Diagnosis not present

## 2023-07-24 DIAGNOSIS — Z01419 Encounter for gynecological examination (general) (routine) without abnormal findings: Secondary | ICD-10-CM | POA: Diagnosis not present

## 2023-07-24 DIAGNOSIS — Z6829 Body mass index (BMI) 29.0-29.9, adult: Secondary | ICD-10-CM | POA: Diagnosis not present

## 2023-07-24 DIAGNOSIS — Z13220 Encounter for screening for lipoid disorders: Secondary | ICD-10-CM | POA: Diagnosis not present

## 2023-08-19 ENCOUNTER — Other Ambulatory Visit (HOSPITAL_COMMUNITY): Payer: Self-pay

## 2023-08-19 MED ORDER — FLUOXETINE HCL 20 MG PO CAPS
60.0000 mg | ORAL_CAPSULE | Freq: Every day | ORAL | 1 refills | Status: AC
  Filled 2023-08-19: qty 270, 90d supply, fill #0
  Filled 2023-11-13: qty 270, 90d supply, fill #1

## 2023-08-20 ENCOUNTER — Other Ambulatory Visit (HOSPITAL_COMMUNITY): Payer: Self-pay

## 2023-09-29 ENCOUNTER — Other Ambulatory Visit (HOSPITAL_COMMUNITY): Payer: Self-pay

## 2023-09-29 MED ORDER — DOXYCYCLINE HYCLATE 20 MG PO TABS
20.0000 mg | ORAL_TABLET | Freq: Two times a day (BID) | ORAL | 0 refills | Status: AC
  Filled 2023-09-29 (×2): qty 180, 90d supply, fill #0

## 2023-09-30 ENCOUNTER — Other Ambulatory Visit (HOSPITAL_COMMUNITY): Payer: Self-pay

## 2023-10-01 ENCOUNTER — Other Ambulatory Visit (HOSPITAL_COMMUNITY): Payer: Self-pay

## 2023-10-01 MED ORDER — HYDROQUINONE 4 % EX CREA
1.0000 | TOPICAL_CREAM | Freq: Every day | CUTANEOUS | 0 refills | Status: DC
  Filled 2023-10-01: qty 28.35, 28d supply, fill #0

## 2024-01-12 DIAGNOSIS — F4323 Adjustment disorder with mixed anxiety and depressed mood: Secondary | ICD-10-CM | POA: Diagnosis not present

## 2024-01-26 DIAGNOSIS — F4323 Adjustment disorder with mixed anxiety and depressed mood: Secondary | ICD-10-CM | POA: Diagnosis not present

## 2024-02-03 DIAGNOSIS — F4323 Adjustment disorder with mixed anxiety and depressed mood: Secondary | ICD-10-CM | POA: Diagnosis not present

## 2024-02-17 DIAGNOSIS — F4323 Adjustment disorder with mixed anxiety and depressed mood: Secondary | ICD-10-CM | POA: Diagnosis not present

## 2024-03-02 DIAGNOSIS — F4323 Adjustment disorder with mixed anxiety and depressed mood: Secondary | ICD-10-CM | POA: Diagnosis not present

## 2024-03-08 ENCOUNTER — Other Ambulatory Visit (HOSPITAL_COMMUNITY): Payer: Self-pay

## 2024-03-08 MED ORDER — HYDROQUINONE 4 % EX CREA
TOPICAL_CREAM | Freq: Every day | CUTANEOUS | 0 refills | Status: AC
  Filled 2024-03-08: qty 28.35, 20d supply, fill #0

## 2024-03-09 ENCOUNTER — Other Ambulatory Visit (HOSPITAL_COMMUNITY): Payer: Self-pay

## 2024-03-09 MED ORDER — FLUOXETINE HCL 20 MG PO CAPS
60.0000 mg | ORAL_CAPSULE | Freq: Every day | ORAL | 1 refills | Status: AC
  Filled 2024-03-09: qty 270, 90d supply, fill #0
